# Patient Record
Sex: Female | Born: 1960 | Race: White | Hispanic: No | Marital: Married | Smoking: Former smoker
Health system: Southern US, Community
[De-identification: ages and names within clinical notes are randomized; demographics above are authoritative.]

## PROBLEM LIST (undated history)

## (undated) DIAGNOSIS — N200 Calculus of kidney: Secondary | ICD-10-CM

## (undated) DIAGNOSIS — R7303 Prediabetes: Secondary | ICD-10-CM

## (undated) DIAGNOSIS — G4730 Sleep apnea, unspecified: Secondary | ICD-10-CM

## (undated) DIAGNOSIS — I10 Essential (primary) hypertension: Secondary | ICD-10-CM

## (undated) DIAGNOSIS — Z87442 Personal history of urinary calculi: Secondary | ICD-10-CM

## (undated) DIAGNOSIS — M1990 Unspecified osteoarthritis, unspecified site: Secondary | ICD-10-CM

## (undated) DIAGNOSIS — K625 Hemorrhage of anus and rectum: Secondary | ICD-10-CM

## (undated) DIAGNOSIS — I499 Cardiac arrhythmia, unspecified: Secondary | ICD-10-CM

## (undated) DIAGNOSIS — F419 Anxiety disorder, unspecified: Secondary | ICD-10-CM

## (undated) DIAGNOSIS — L409 Psoriasis, unspecified: Secondary | ICD-10-CM

## (undated) DIAGNOSIS — J45909 Unspecified asthma, uncomplicated: Secondary | ICD-10-CM

## (undated) DIAGNOSIS — K760 Fatty (change of) liver, not elsewhere classified: Secondary | ICD-10-CM

## (undated) HISTORY — DX: Anxiety disorder, unspecified: F41.9

## (undated) HISTORY — DX: Calculus of kidney: N20.0

## (undated) HISTORY — PX: ABDOMINAL HYSTERECTOMY: SHX81

## (undated) HISTORY — PX: CHOLECYSTECTOMY: SHX55

## (undated) HISTORY — PX: APPENDECTOMY: SHX54

## (undated) HISTORY — DX: Hemorrhage of anus and rectum: K62.5

## (undated) HISTORY — DX: Cardiac arrhythmia, unspecified: I49.9

---

## 2007-01-18 ENCOUNTER — Ambulatory Visit: Payer: Self-pay | Admitting: Family Medicine

## 2007-02-05 ENCOUNTER — Ambulatory Visit: Payer: Self-pay | Admitting: Obstetrics and Gynecology

## 2008-02-21 ENCOUNTER — Ambulatory Visit: Payer: Self-pay | Admitting: Family Medicine

## 2009-11-24 ENCOUNTER — Ambulatory Visit: Payer: Self-pay | Admitting: Family Medicine

## 2010-03-08 ENCOUNTER — Ambulatory Visit: Payer: Self-pay | Admitting: Family Medicine

## 2010-06-09 ENCOUNTER — Ambulatory Visit: Payer: Self-pay | Admitting: Family Medicine

## 2011-03-10 ENCOUNTER — Ambulatory Visit: Payer: Self-pay | Admitting: Family Medicine

## 2011-05-08 ENCOUNTER — Ambulatory Visit: Payer: Self-pay | Admitting: Internal Medicine

## 2011-08-17 ENCOUNTER — Observation Stay: Payer: Self-pay | Admitting: Internal Medicine

## 2012-07-04 ENCOUNTER — Ambulatory Visit: Payer: Self-pay | Admitting: Family Medicine

## 2012-11-09 ENCOUNTER — Emergency Department: Payer: Self-pay | Admitting: Emergency Medicine

## 2012-11-09 LAB — BASIC METABOLIC PANEL
BUN: 11 mg/dL (ref 7–18)
Calcium, Total: 9.3 mg/dL (ref 8.5–10.1)
Co2: 27 mmol/L (ref 21–32)
EGFR (African American): >60
EGFR (Non-African Amer.): >60
Osmolality: 277 (ref 275–301)
Sodium: 139 mmol/L (ref 136–145)

## 2012-11-09 LAB — CBC
HCT: 39.1 % (ref 35.0–47.0)
HGB: 13.4 g/dL (ref 12.0–16.0)
MCH: 28 pg (ref 26.0–34.0)
MCHC: 34.2 g/dL (ref 32.0–36.0)
RBC: 4.77 10*6/uL (ref 3.80–5.20)
RDW: 14 % (ref 11.5–14.5)
WBC: 5.7 10*3/uL (ref 3.6–11.0)

## 2012-11-09 LAB — CK TOTAL AND CKMB (NOT AT ARMC): CK-MB: 0.7 ng/mL (ref 0.5–3.6)

## 2012-11-29 ENCOUNTER — Ambulatory Visit: Payer: Self-pay | Admitting: Gastroenterology

## 2013-06-19 ENCOUNTER — Ambulatory Visit: Payer: Self-pay | Admitting: Family Medicine

## 2013-09-13 ENCOUNTER — Emergency Department: Payer: Self-pay | Admitting: Emergency Medicine

## 2013-09-13 LAB — CBC
HCT: 37.7 % (ref 35.0–47.0)
HGB: 13.2 g/dL (ref 12.0–16.0)
MCH: 28.7 pg (ref 26.0–34.0)
WBC: 6.3 10*3/uL (ref 3.6–11.0)

## 2013-09-13 LAB — COMPREHENSIVE METABOLIC PANEL
Albumin: 3.9 g/dL (ref 3.4–5.0)
Anion Gap: 5 — ABNORMAL LOW (ref 7–16)
Bilirubin,Total: 0.5 mg/dL (ref 0.2–1.0)
Calcium, Total: 9.3 mg/dL (ref 8.5–10.1)
Creatinine: 0.85 mg/dL (ref 0.60–1.30)
EGFR (African American): >60
EGFR (Non-African Amer.): >60
Osmolality: 276 (ref 275–301)
Potassium: 3.7 mmol/L (ref 3.5–5.1)
SGOT(AST): 68 U/L — ABNORMAL HIGH (ref 15–37)
SGPT (ALT): 69 U/L (ref 12–78)
Sodium: 138 mmol/L (ref 136–145)

## 2013-09-13 LAB — URINALYSIS, COMPLETE
Glucose,UR: NEGATIVE mg/dL (ref 0–75)
Ketone: NEGATIVE
RBC,UR: 1 /HPF (ref 0–5)
Squamous Epithelial: 2

## 2014-01-29 ENCOUNTER — Ambulatory Visit: Payer: Self-pay | Admitting: Family Medicine

## 2014-02-26 ENCOUNTER — Ambulatory Visit: Payer: Self-pay | Admitting: Gastroenterology

## 2014-03-04 LAB — PATHOLOGY REPORT

## 2014-03-06 ENCOUNTER — Ambulatory Visit: Payer: Self-pay | Admitting: Gastroenterology

## 2014-07-16 DIAGNOSIS — E119 Type 2 diabetes mellitus without complications: Secondary | ICD-10-CM | POA: Insufficient documentation

## 2014-07-16 DIAGNOSIS — F329 Major depressive disorder, single episode, unspecified: Secondary | ICD-10-CM | POA: Insufficient documentation

## 2014-07-16 DIAGNOSIS — E559 Vitamin D deficiency, unspecified: Secondary | ICD-10-CM | POA: Insufficient documentation

## 2014-07-16 DIAGNOSIS — F419 Anxiety disorder, unspecified: Secondary | ICD-10-CM | POA: Insufficient documentation

## 2014-07-16 DIAGNOSIS — F32A Depression, unspecified: Secondary | ICD-10-CM | POA: Insufficient documentation

## 2014-10-17 IMAGING — CT CT ABD-PELV W/ CM
1 of 2 series · 15 of 32 positions shown, 19 images · non-contrast
Comparison: none

REASON FOR EXAM: (1) ABD PAIN; (2) ABD PAIN
COMMENTS:

PROCEDURE:     CT  - CT ABDOMEN / PELVIS  W  - September 13, 2013  [DATE]
RESULT:     History: Pain.
Comparison Study: CT 06/09/2010.

[Series 2: 3mm soft tissue · axial · 0.86mm/px · z∈[-274,+174]mm · 15 of 163 slices shown, 19 images]
[im 7/163  soft-tissue]
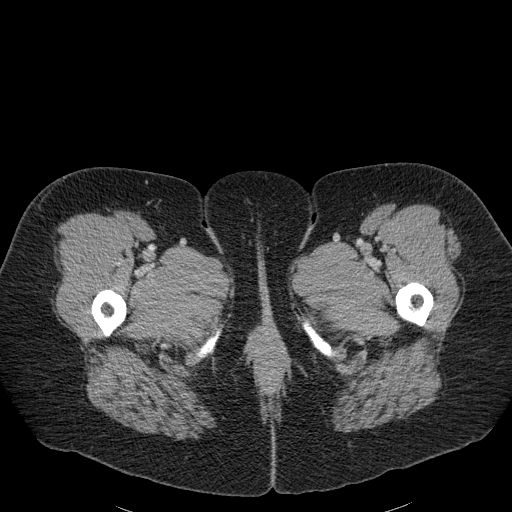
[im 7/163  bone]
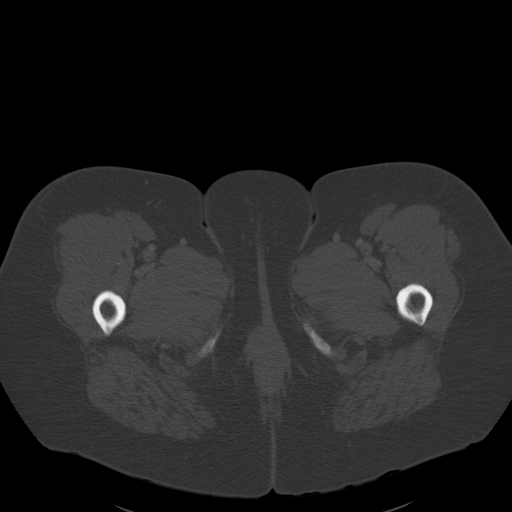
[im 21/163  soft-tissue]
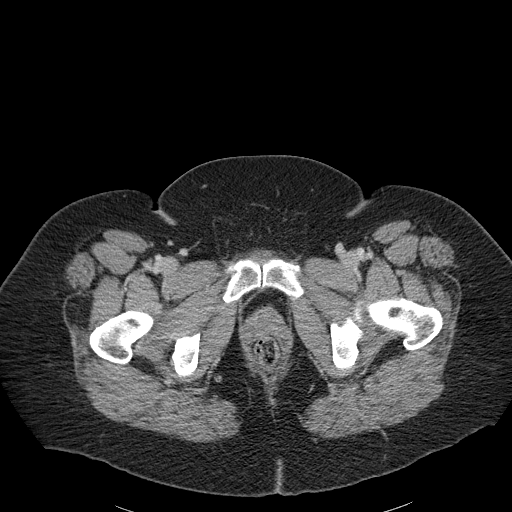
[im 34/163  soft-tissue]
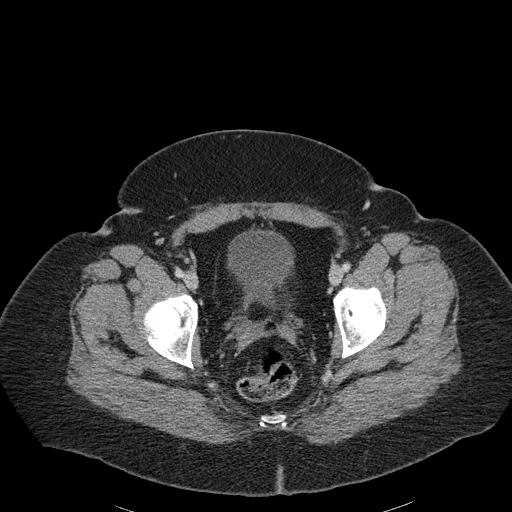
[im 48/163  soft-tissue]
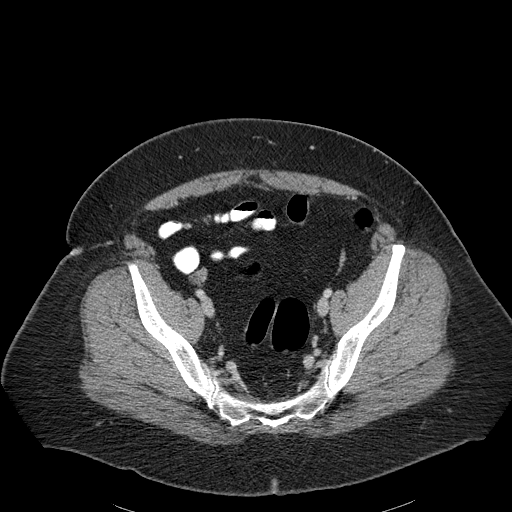
[im 55/163  soft-tissue]
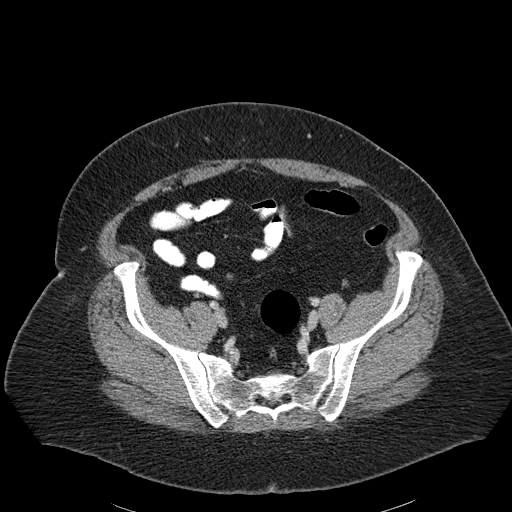
[im 68/163  soft-tissue]
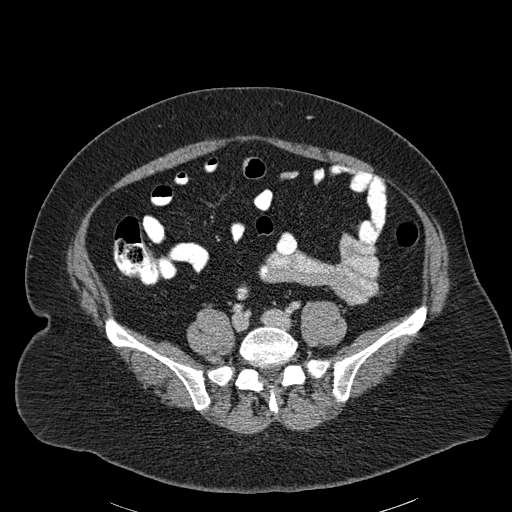
[im 82/163  soft-tissue]
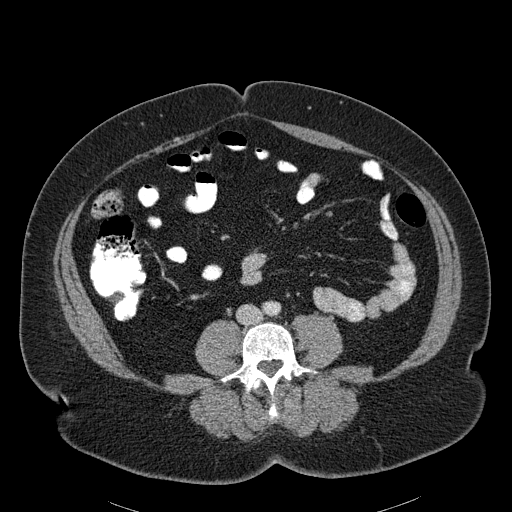
[im 95/163  soft-tissue]
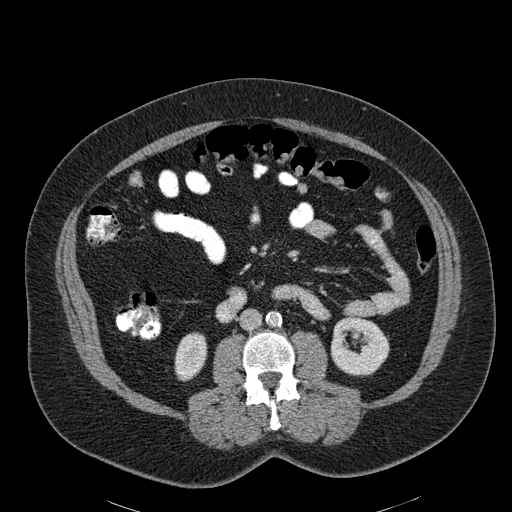
[im 109/163  soft-tissue]
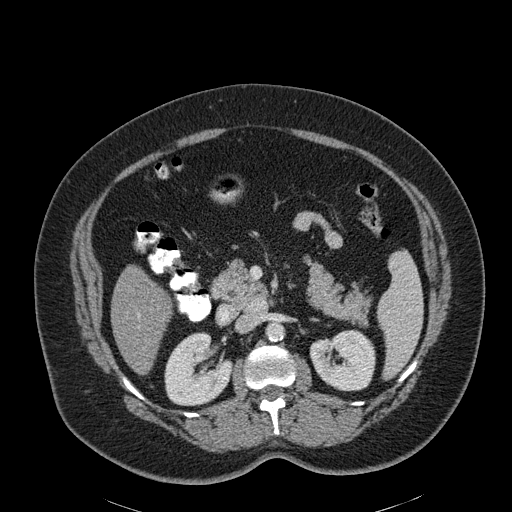
[im 109/163  bone]
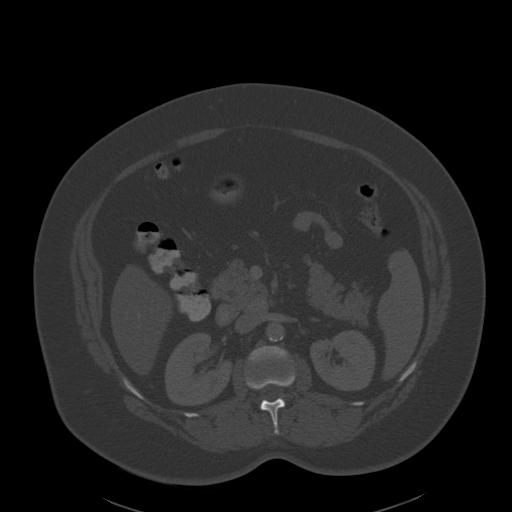
[im 115/163  soft-tissue]
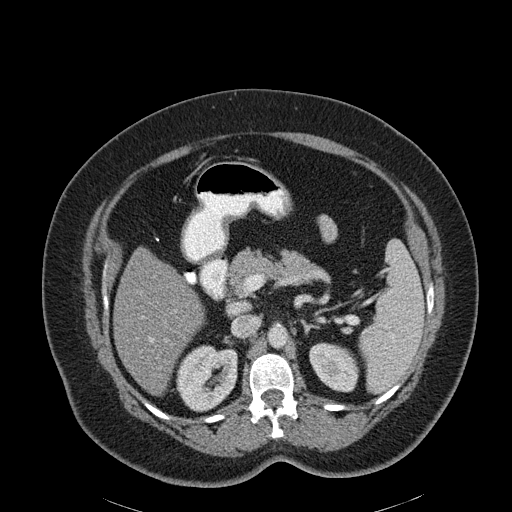
[im 129/163  soft-tissue]
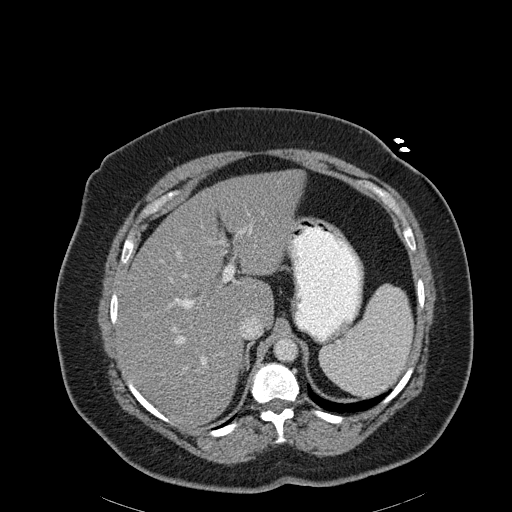
[im 136/163  lung]
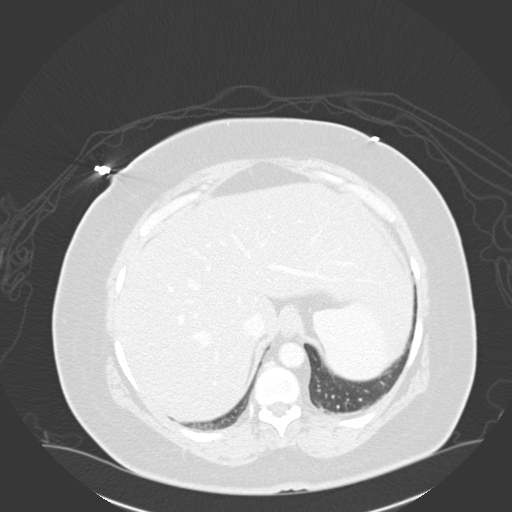
[im 142/163  soft-tissue]
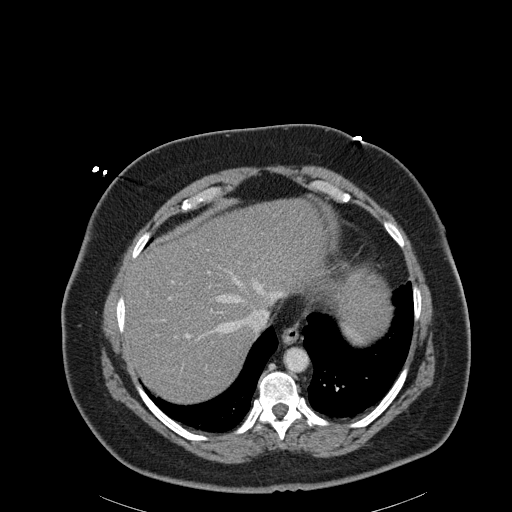
[im 142/163  lung]
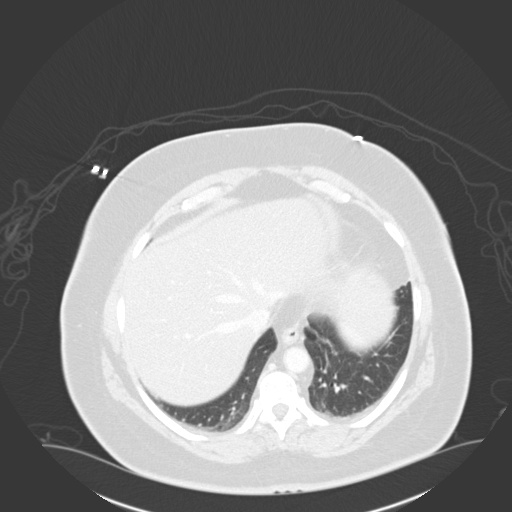
[im 149/163  lung]
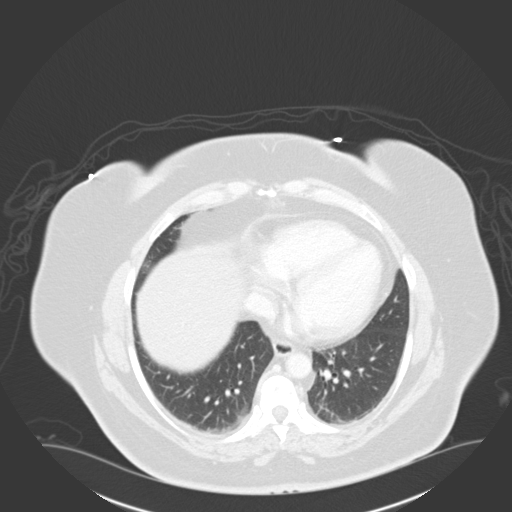
[im 156/163  soft-tissue]
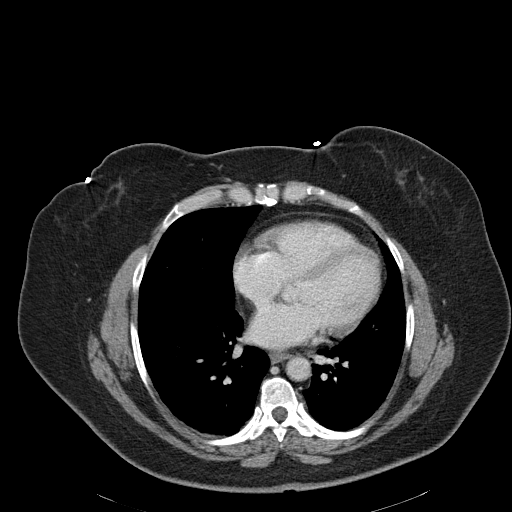
[im 156/163  lung]
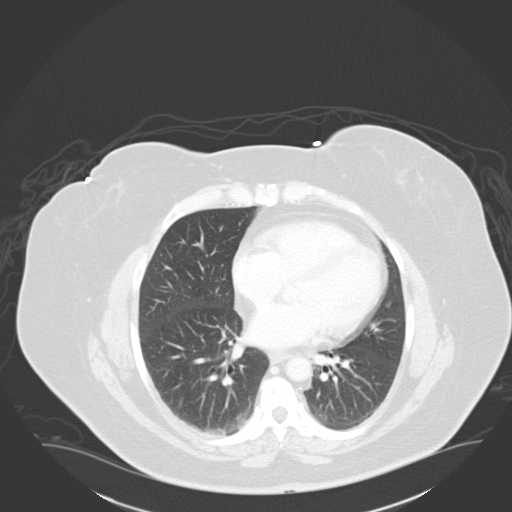

[15 of 32 positions shown; findings below may reference images not displayed]

FINDINGS: Standard CT obtained with 100 cc of Gsovue-BI2. Evaluation  in 3
dimensions on separate workstation performed. Fatty infiltration liver.
Surgical clips right upper quadrant. Prior cholecystectomy. No biliary
distention. Pancreas normal. Spleen normal. Splenic vein and portal vein
patent.

Adrenals normal. No focal renal cortical abnormality. Nonobstructive left
nephrolithiasis. No obstructing ureteral stone. No hydronephrosis. Bladder
is nondistended. Hysterectomy. Adnexa unremarkable. No free pelvic fluid.

No significant inguinal adenopathy or hernia. No retroperitoneal adenopathy.
Aorta patent. Mesenteric and renal vessels patent.

The appendix not visualized. No inflammatory change in right or left or
quadrant. Stomach nondistended. Distal esophagus unremarkable.

Heart size normal. Mild atelectasis lung bases. No acute bony abnormality.
IMPRESSION: No acute abnormality. Nonobstructive left nephrolithiasis.

## 2016-11-12 ENCOUNTER — Encounter: Payer: Self-pay | Admitting: Emergency Medicine

## 2016-11-12 ENCOUNTER — Emergency Department: Payer: BLUE CROSS/BLUE SHIELD

## 2016-11-12 ENCOUNTER — Observation Stay
Admission: EM | Admit: 2016-11-12 | Discharge: 2016-11-14 | Disposition: A | Discharge status: Home / Self Care | Payer: BLUE CROSS/BLUE SHIELD | Attending: Internal Medicine | Admitting: Internal Medicine

## 2016-11-12 DIAGNOSIS — Z833 Family history of diabetes mellitus: Secondary | ICD-10-CM | POA: Insufficient documentation

## 2016-11-12 DIAGNOSIS — R0789 Other chest pain: Secondary | ICD-10-CM

## 2016-11-12 DIAGNOSIS — Z881 Allergy status to other antibiotic agents status: Secondary | ICD-10-CM | POA: Insufficient documentation

## 2016-11-12 DIAGNOSIS — Z6841 Body Mass Index (BMI) 40.0 and over, adult: Secondary | ICD-10-CM | POA: Diagnosis not present

## 2016-11-12 DIAGNOSIS — Z8249 Family history of ischemic heart disease and other diseases of the circulatory system: Secondary | ICD-10-CM | POA: Insufficient documentation

## 2016-11-12 DIAGNOSIS — Z7982 Long term (current) use of aspirin: Secondary | ICD-10-CM | POA: Diagnosis not present

## 2016-11-12 DIAGNOSIS — L409 Psoriasis, unspecified: Secondary | ICD-10-CM | POA: Diagnosis not present

## 2016-11-12 DIAGNOSIS — F419 Anxiety disorder, unspecified: Secondary | ICD-10-CM | POA: Diagnosis not present

## 2016-11-12 DIAGNOSIS — Z880 Allergy status to penicillin: Secondary | ICD-10-CM | POA: Diagnosis not present

## 2016-11-12 DIAGNOSIS — I10 Essential (primary) hypertension: Secondary | ICD-10-CM | POA: Diagnosis not present

## 2016-11-12 DIAGNOSIS — E785 Hyperlipidemia, unspecified: Secondary | ICD-10-CM | POA: Insufficient documentation

## 2016-11-12 DIAGNOSIS — R079 Chest pain, unspecified: Secondary | ICD-10-CM | POA: Diagnosis present

## 2016-11-12 DIAGNOSIS — Z9049 Acquired absence of other specified parts of digestive tract: Secondary | ICD-10-CM | POA: Diagnosis not present

## 2016-11-12 DIAGNOSIS — Z87891 Personal history of nicotine dependence: Secondary | ICD-10-CM | POA: Insufficient documentation

## 2016-11-12 DIAGNOSIS — E7849 Other hyperlipidemia: Secondary | ICD-10-CM

## 2016-11-12 HISTORY — DX: Essential (primary) hypertension: I10

## 2016-11-12 HISTORY — DX: Psoriasis, unspecified: L40.9

## 2016-11-12 LAB — CBC
HCT: 42.6 % (ref 35.0–47.0)
HEMOGLOBIN: 14.9 g/dL (ref 12.0–16.0)
MCH: 28.7 pg (ref 26.0–34.0)
MCHC: 35 g/dL (ref 32.0–36.0)
MCV: 81.9 fL (ref 80.0–100.0)
Platelets: 179 10*3/uL (ref 150–440)
RBC: 5.2 MIL/uL (ref 3.80–5.20)
RDW: 14.1 % (ref 11.5–14.5)
WBC: 6.2 10*3/uL (ref 3.6–11.0)

## 2016-11-12 LAB — COMPREHENSIVE METABOLIC PANEL
ALK PHOS: 71 U/L (ref 38–126)
ALT: 35 U/L (ref 14–54)
ANION GAP: 8 (ref 5–15)
AST: 39 U/L (ref 15–41)
Albumin: 4.5 g/dL (ref 3.5–5.0)
BUN: 17 mg/dL (ref 6–20)
CALCIUM: 9.6 mg/dL (ref 8.9–10.3)
CO2: 26 mmol/L (ref 22–32)
CREATININE: 1.03 mg/dL — AB (ref 0.44–1.00)
Chloride: 106 mmol/L (ref 101–111)
GFR, EST NON AFRICAN AMERICAN: 60 mL/min — AB (ref >60)
Glucose, Bld: 158 mg/dL — ABNORMAL HIGH (ref 65–99)
Potassium: 3.8 mmol/L (ref 3.5–5.1)
Sodium: 140 mmol/L (ref 135–145)
TOTAL PROTEIN: 7.7 g/dL (ref 6.5–8.1)
Total Bilirubin: 0.8 mg/dL (ref 0.3–1.2)

## 2016-11-12 LAB — FIBRIN DERIVATIVES D-DIMER (ARMC ONLY): FIBRIN DERIVATIVES D-DIMER (ARMC): 376 (ref 0–499)

## 2016-11-12 LAB — TSH: TSH: 4.731 u[IU]/mL — AB (ref 0.350–4.500)

## 2016-11-12 LAB — TROPONIN I: Troponin I: <0.03 ng/mL (ref <0.03)

## 2016-11-12 MED ORDER — SODIUM CHLORIDE 0.9 % IV SOLN
INTRAVENOUS | Status: DC
  Administered 2016-11-12 – 2016-11-14 (×4): via INTRAVENOUS

## 2016-11-12 MED ORDER — GI COCKTAIL ~~LOC~~: 30.0000 mL | Freq: Four times a day (QID) | ORAL | Status: DC | PRN

## 2016-11-12 MED ORDER — ENOXAPARIN SODIUM 100 MG/ML ~~LOC~~ SOLN
1.0000 mg/kg | Freq: Once | SUBCUTANEOUS | Status: AC
Start: 2016-11-12 — End: 2016-11-12
  Administered 2016-11-12: 100 mg via SUBCUTANEOUS
  Filled 2016-11-12: qty 1

## 2016-11-12 MED ORDER — ALPRAZOLAM 0.5 MG PO TABS: 0.2500 mg | ORAL_TABLET | Freq: Two times a day (BID) | ORAL | Status: DC | PRN

## 2016-11-12 MED ORDER — ACETAMINOPHEN 325 MG PO TABS: 650.0000 mg | ORAL_TABLET | ORAL | Status: DC | PRN

## 2016-11-12 MED ORDER — NITROGLYCERIN 0.4 MG SL SUBL
0.4000 mg | SUBLINGUAL_TABLET | Freq: Once | SUBLINGUAL | Status: AC
  Administered 2016-11-12: 0.4 mg via SUBLINGUAL

## 2016-11-12 MED ORDER — ENOXAPARIN SODIUM 100 MG/ML ~~LOC~~ SOLN
1.0000 mg/kg | Freq: Two times a day (BID) | SUBCUTANEOUS | Status: DC
  Administered 2016-11-13: 100 mg via SUBCUTANEOUS
  Filled 2016-11-12: qty 1

## 2016-11-12 MED ORDER — MORPHINE SULFATE (PF) 4 MG/ML IV SOLN
4.0000 mg | Freq: Once | INTRAVENOUS | Status: AC
  Administered 2016-11-12: 4 mg via INTRAVENOUS
  Filled 2016-11-12: qty 1

## 2016-11-12 MED ORDER — MORPHINE SULFATE (PF) 4 MG/ML IV SOLN
2.0000 mg | INTRAVENOUS | Status: DC | PRN
  Administered 2016-11-12 – 2016-11-13 (×2): 2 mg via INTRAVENOUS
  Filled 2016-11-12 (×2): qty 1

## 2016-11-12 MED ORDER — ASPIRIN 81 MG PO CHEW
324.0000 mg | CHEWABLE_TABLET | Freq: Once | ORAL | Status: AC
  Administered 2016-11-12: 324 mg via ORAL
  Filled 2016-11-12: qty 4

## 2016-11-12 MED ORDER — NITROGLYCERIN 0.4 MG SL SUBL
0.4000 mg | SUBLINGUAL_TABLET | Freq: Once | SUBLINGUAL | Status: AC
  Administered 2016-11-12: 0.4 mg via SUBLINGUAL
  Filled 2016-11-12: qty 1

## 2016-11-12 MED ORDER — ONDANSETRON HCL 4 MG/2ML IJ SOLN
4.0000 mg | Freq: Four times a day (QID) | INTRAMUSCULAR | Status: DC | PRN
  Administered 2016-11-12 – 2016-11-13 (×3): 4 mg via INTRAVENOUS
  Filled 2016-11-12 (×3): qty 2

## 2016-11-12 MED ORDER — ZOLPIDEM TARTRATE 5 MG PO TABS: 5.0000 mg | ORAL_TABLET | Freq: Every evening | ORAL | Status: DC | PRN

## 2016-11-12 MED ORDER — METOPROLOL TARTRATE 25 MG PO TABS
25.0000 mg | ORAL_TABLET | Freq: Two times a day (BID) | ORAL | Status: DC
  Administered 2016-11-12: 25 mg via ORAL
  Filled 2016-11-12: qty 1

## 2016-11-12 MED ORDER — ASPIRIN EC 81 MG PO TBEC
81.0000 mg | DELAYED_RELEASE_TABLET | Freq: Every day | ORAL | Status: DC
  Administered 2016-11-13 – 2016-11-14 (×2): 81 mg via ORAL
  Filled 2016-11-12 (×2): qty 1

## 2016-11-12 NOTE — ED Notes (Signed)
Pt transported to room 236

## 2016-11-12 NOTE — Progress Notes (Signed)
Patient is admitted to room 236 with he diagnosis of chest pain. Alert and oriented x 4. No acute distress noted. Tele box called to CCMD with Gelene Mink. RN as a second verifier. Vincente Liberty also assisted in doing skin assessment. Skin dry and intact but noted psoriasis on bilateral palms and sole of feet. No issues noted. Morphine 2 mg iv prn administered for  chest pain of 5/10 on a pain scale and will be re-assess momentarily. Will continue to monitor.

## 2016-11-12 NOTE — ED Notes (Signed)
Attempted to call report, Network engineer states receiving nurse in isolation room and will call back

## 2016-11-12 NOTE — Progress Notes (Signed)
ANTICOAGULATION CONSULT NOTE - Initial Consult  Pharmacy Consult for LMWH Indication: chest pain/ACS  Allergies  Allergen Reactions  . Ciprocin-Fluocin-Procin [Fluocinolone] Rash  . Erythromycin Rash  . Penicillins Rash    Has patient had a PCN reaction causing immediate rash, facial/tongue/throat swelling, SOB or lightheadedness with hypotension: no Has patient had a PCN reaction causing severe rash involving mucus membranes or skin necrosis: no Has patient had a PCN reaction that required hospitalization no Has patient had a PCN reaction occurring within the last 10 years: no If all of the above answers are "NO", then may proceed with Cephalosporin use.    . Tetracyclines & Related Rash    Patient Measurements: Height: 5' (152.4 cm) Weight: 220 lb (99.8 kg) IBW/kg (Calculated) : 45.5 Heparin Dosing Weight:   Vital Signs: Temp: 98.1 F (36.7 C) (11/25 2024) Temp Source: Oral (11/25 2024) BP: 159/70 (11/25 2230) Pulse Rate: 89 (11/25 2230)  Labs:  Recent Labs  11/12/16 1820  HGB 14.9  HCT 42.6  PLT 179  CREATININE 1.03*  TROPONINI <0.03    Estimated Creatinine Clearance: 64.7 mL/min (by C-G formula based on SCr of 1.03 mg/dL (H)).   Medical History: Past Medical History:  Diagnosis Date  . Hypertension   . Psoriasis     Medications:  Infusions:  . sodium chloride      Assessment: 55 yof cc CP, radiating to back and neck since yesterday. SOB, dizziness, nausea, back pain. Sx improved with NTG. Pharmacy consulted to dose LMWH while r/o.  Goal of Therapy:  Anti-Xa level 0.6-1 units/ml 4hrs after LMWH dose given Monitor platelets by anticoagulation protocol: Yes   Plan:  Lovenox 100 mg subcutaneously Q12H  Laural Benes, Pharm.D., BCPS Clinical Pharmacist 11/12/2016,11:01 PM

## 2016-11-12 NOTE — ED Triage Notes (Signed)
Pt reports central chest pain that radiates to back and neck that began yesterday. Pt states she thought the pain was indigestion but it got worse today. Pt reports SOB, dizziness, nausea and back pain.

## 2016-11-12 NOTE — ED Provider Notes (Addendum)
[facility] Emergency Department Provider Note        Time seen: ----------------------------------------- 8:31 PM on 11/12/2016 -----------------------------------------    I have reviewed the triage vital signs and the nursing notes.   HISTORY  Chief Complaint Chest Pain    HPI Amber Ortiz is a 55 y.o. female presents to the ER with chest pain that radiates into her back and neck began yesterday. Patient states she thought it was indigestion but it got worse today. Patient has never had this happen before, nothing makes it better or worse. She has had shortness of breath, dizziness, nausea and back pain. Patient reports family history as well as high blood pressure, high cholesterol, diabetes, obesity and previous tobacco use.   Past Medical History:  Diagnosis Date  . Hypertension   . Psoriasis     There are no active problems to display for this patient.   No past surgical history on file.  Allergies Ciprocin-fluocin-procin [fluocinolone]; Erythromycin; Penicillins; and Tetracyclines & related  Social History Social History  Substance Use Topics  . Smoking status: Not on file  . Smokeless tobacco: Not on file  . Alcohol use Not on file    Review of Systems Constitutional: Negative for fever. Cardiovascular:Positive for chest pain Respiratory: Positive for shortness of breath Gastrointestinal: Negative for abdominal pain, positive for nausea Genitourinary: Negative for dysuria. Musculoskeletal: Positive for back pain Skin: Negative for rash. Neurological: Negative for headaches, focal weakness or numbness.  10-point ROS otherwise negative.  ____________________________________________   PHYSICAL EXAM:  VITAL SIGNS: ED Triage Vitals  Enc Vitals Group     BP 11/12/16 1818 (!) 186/52     Pulse Rate 11/12/16 1818 100     Resp 11/12/16 1818 20     Temp 11/12/16 1818 97.9 F (36.6 C)     Temp Source 11/12/16 1818 Oral      SpO2 11/12/16 1818 95 %     Weight 11/12/16 1815 220 lb (99.8 kg)     Height 11/12/16 1815 5' (1.524 m)     Head Circumference --      Peak Flow --      Pain Score 11/12/16 1815 8     Pain Loc --      Pain Edu? --      Excl. in [place]? --     Constitutional: Alert and oriented. Mild distress Eyes: Conjunctivae are normal. PERRL. Normal extraocular movements. ENT   Head: Normocephalic and atraumatic.   Nose: No congestion/rhinnorhea.   Mouth/Throat: Mucous membranes are moist.   Neck: No stridor. Cardiovascular: Normal rate, regular rhythm. No murmurs, rubs, or gallops. Respiratory: Normal respiratory effort without tachypnea nor retractions. Breath sounds are clear and equal bilaterally. No wheezes/rales/rhonchi. Gastrointestinal: Soft and nontender. Normal bowel sounds Musculoskeletal: Nontender with normal range of motion in all extremities. No lower extremity tenderness nor edema. Neurologic:  Normal speech and language. No gross focal neurologic deficits are appreciated.  Skin:  Skin is warm, dry and intact. No rash noted. Psychiatric: Mood and affect are normal. Speech and behavior are normal.  ____________________________________________  EKG: Interpreted by me. Sinus tachycardia with a rate of 106 bpm, normal PR interval, normal QRS sinus, normal QT, nonspecific ST and T-wave abnormalities  Repeat EKG interpreted by me reveals sinus rhythm with a rate of 99 bpm, normal PR interval, normal QRS sinus, normal QT, nonspecific ST and T-wave abnormalities ____________________________________________  ED COURSE:  Pertinent labs & imaging results that were available during my care  of the patient were reviewed by me and considered in my medical decision making (see chart for details). Clinical Course   Patient presents to ER with severe chest pain uncertain etiology. We will assess with labs and imaging. She'll receive aspirin, nitroglycerin and  morphine.  Procedures ____________________________________________   LABS (pertinent positives/negatives)  Labs Reviewed  COMPREHENSIVE METABOLIC PANEL - Abnormal; Notable for the following:       Result Value   Glucose, Bld 158 (*)    Creatinine, Ser 1.03 (*)    GFR calc non Af Amer 60 (*)    All other components within normal limits  CBC  TROPONIN I  FIBRIN DERIVATIVES D-DIMER ([facility] ONLY)  CRITICAL CARE Performed by: Earleen Newport   Total critical care time: 30 minutes  Critical care time was exclusive of separately billable procedures and treating other patients.  Critical care was necessary to treat or prevent imminent or life-threatening deterioration.  Critical care was time spent personally by me on the following activities: development of treatment plan with patient and/or surrogate as well as nursing, discussions with consultants, evaluation of patient's response to treatment, examination of patient, obtaining history from patient or surrogate, ordering and performing treatments and interventions, ordering and review of laboratory studies, ordering and review of radiographic studies, pulse oximetry and re-evaluation of patient's condition.   RADIOLOGY  Chest x-ray is unremarkable IMPRESSION: No active cardiopulmonary disease. ____________________________________________  FINAL ASSESSMENT AND PLAN  Chest pain   Plan: Patient with labs and imaging as dictated above. Patient presents to ER with concerning chest pain and his high risk according to heart score. She did have improvement in her symptoms with nitroglycerin. Patient was placed on Lovenox, I will discuss with the hospitalist for a formal cardiac rule out and likely stress testing.   Earleen Newport, MD   Note: This dictation was prepared with Dragon dictation. Any transcriptional errors that result from this process are unintentional    Earleen Newport, MD 11/12/16 2101     Earleen Newport, MD 11/12/16 2102

## 2016-11-12 NOTE — H&P (Signed)
[facility] Admission History and Physical [employer], D.O.  ---------------------------------------------------------------------------------------------------------------------   PATIENT NAME: Amber Ortiz MR#: [id number] DATE OF BIRTH: 1961-06-18 DATE OF ADMISSION: 11/12/2016 PRIMARY CARE PHYSICIAN: No primary care provider on file.  REQUESTING/REFERRING PHYSICIAN: ED Dr. Jimmye Norman  CHIEF COMPLAINT: Chief Complaint  Patient presents with  . Chest Pain    HISTORY OF PRESENT ILLNESS: Amber Ortiz is a 55 y.o. female with a known history of Hypertension, hyperlipidemia and psoriasis presents to the emergency department for evaluation of chest pain.  Patient was in a usual state of health until this evening when she developed sudden onset of severe crushing chest pain that radiated up to her jaw and through to the back while she was at rest. Her pain was relieved by nitroglycerin in the emergency department and was associated with nausea and palpitations. Patient states that she had what sounds like a nuclear stress test which she failed but no further cardiovascular testing or intervention following that.  At this time she reports continued chest pressure described as 5 out of 10.  Otherwise there has been no change in status. Patient has been taking medication as prescribed and there has been no recent change in medication or diet.  There has been no recent illness, travel or sick contacts.    Patient denies fevers/chills, weakness, dizziness, shortness of breath, N/V/C/D, abdominal pain, dysuria/frequency, changes in mental status.   EMS/ED COURSE:   Patient received morphine, nitroglycerin, aspirin and Lovenox.Marland Kitchen  PAST MEDICAL HISTORY: Past Medical History:  Diagnosis Date  . Hypertension   . Psoriasis       PAST SURGICAL HISTORY: No past surgical history on file.    SOCIAL HISTORY: Social History  Substance Use Topics  . Smoking  status: Not on file  . Smokeless tobacco: Not on file  . Alcohol use Not on file   Patient is a former smoker, quit 30 years ago.   FAMILY HISTORY: No family history on file.   MEDICATIONS AT HOME: Prior to Admission medications   Not on File      DRUG ALLERGIES: Allergies  Allergen Reactions  . Ciprocin-Fluocin-Procin [Fluocinolone] Rash  . Erythromycin Rash  . Penicillins Rash  . Tetracyclines & Related Rash     REVIEW OF SYSTEMS: CONSTITUTIONAL: No fatigue, weakness, fever, chills, weight gain/loss, headache EYES: No blurry or double vision. ENT: No tinnitus, postnasal drip, redness or soreness of the oropharynx. RESPIRATORY: No dyspnea, cough, wheeze, hemoptysis. CARDIOVASCULAR: Positive chest pain, negative orthopnea, palpitations, syncope. GASTROINTESTINAL: Positive nausea, negative vomiting, constipation, diarrhea, abdominal pain. No hematemesis, melena or hematochezia. GENITOURINARY: No dysuria, frequency, hematuria. ENDOCRINE: No polyuria or nocturia. No heat or cold intolerance. HEMATOLOGY: No anemia, bruising, bleeding. INTEGUMENTARY: No rashes, ulcers, lesions. MUSCULOSKELETAL: No pain, arthritis, swelling, gout. NEUROLOGIC: No numbness, tingling, weakness or ataxia. No seizure-type activity. PSYCHIATRIC: No anxiety, depression, insomnia.  PHYSICAL EXAMINATION: VITAL SIGNS: Blood pressure (!) 129/50, pulse 95, temperature 98.1 F (36.7 C), temperature source Oral, resp. rate 19, height 5' (1.524 m), weight 99.8 kg (220 lb), SpO2 94 %.  GENERAL: 55 y.o.-year-old white female patient, well-developed, well-nourished lying in the bed in no acute distress.  Pleasant and cooperative.  Anxious. HEENT: Head atraumatic, normocephalic. Pupils equal, round, reactive to light and accommodation. No scleral icterus. Extraocular muscles intact. Oropharynx is clear. Mucus membranes moist. NECK: Supple, full range of motion. No JVD, no bruit heard. No cervical  lymphadenopathy. CHEST: Normal breath sounds bilaterally. No wheezing, rales, rhonchi or crackles.  No use of accessory muscles of respiration.  No reproducible chest wall tenderness.  CARDIOVASCULAR: S1, S2 normal. No murmurs, rubs, or gallops appreciated. Cap refill <2 seconds. ABDOMEN: Soft, nontender, nondistended. No rebound, guarding, rigidity. Normoactive bowel sounds present in all four quadrants. No organomegaly or mass. EXTREMITIES: Bilateral hand swelling, right greater than left. No pedal edema, cyanosis, or clubbing. NEUROLOGIC: Cranial nerves II through XII are grossly intact with no focal sensorimotor deficit. Muscle strength 5/5 in all extremities. Sensation intact. Gait not checked. PSYCHIATRIC: The patient is alert and oriented x 3. Normal affect, mood, thought content. SKIN: Warm, dry, and intact without obvious rash, lesion, or ulcer.  LABORATORY PANEL:  CBC  Recent Labs Lab 11/12/16 1820  WBC 6.2  HGB 14.9  HCT 42.6  PLT 179   ----------------------------------------------------------------------------------------------------------------- Chemistries  Recent Labs Lab 11/12/16 1820  NA 140  K 3.8  CL 106  CO2 26  GLUCOSE 158*  BUN 17  CREATININE 1.03*  CALCIUM 9.6  AST 39  ALT 35  ALKPHOS 71  BILITOT 0.8   ------------------------------------------------------------------------------------------------------------------ Cardiac Enzymes  Recent Labs Lab 11/12/16 1820  TROPONINI <0.03   ------------------------------------------------------------------------------------------------------------------  RADIOLOGY: Dg Chest 2 View  Result Date: 11/12/2016 CLINICAL DATA:  Patient with chest pain radiating to the back. EXAM: CHEST  2 VIEW COMPARISON:  Chest radiograph 11/09/2012. FINDINGS: Normal cardiac and mediastinal contours. No consolidative pulmonary opacities. No pleural effusion or pneumothorax. Mid thoracic spine degenerative changes.  Cholecystectomy clips. IMPRESSION: No active cardiopulmonary disease. Electronically Signed   By: Lovey Newcomer M.D.   On: 11/12/2016 19:23    EKG: Sinus tachycardia at 106 bpm with normal axis and nonspecific ST-T wave changes.  Second EKG was normal sinus rhythm at 99 bpm with normal axis and nonspecific ST and T wave changes. IMPRESSION AND PLAN:  This is a 55 y.o. female with a history of hypertension, hyperlipidemia and psoriasis now being admitted with: 1. Chest pain, rule out ACS - Admit to observation with telemetry monitoring. - Trend troponins, check lipids and TSH. - Morphine, nitro, beta blocker, aspirin and statin ordered.   - Patient received 1 dose of weight-based Lovenox in the emergency department - Cardiology consult requested.   2. AK I-IV fluid hydration and recheck BMP in the a.m. 3. History of hypertension-continue metoprolol and lisinopril 4. History of hyperlipidemia-continue pravastatin  Diet/Nutrition: Heart healthy Fluids: IV normal saline DVT Px: Lovenox, SCDs and early ambulation Code Status: Full  All the records are reviewed and case discussed with ED provider. Management plans discussed with the patient and/or family who express understanding and agree with plan of care.   TOTAL TIME TAKING CARE OF THIS PATIENT: 60 minutes.   Stacy Sailer D.O. on 11/12/2016 at 9:51 PM Between 7am to 6pm - Pager - [phone] After 6pm go to [email or web address] - [employer] [place] [facility] Office [phone] CC: Primary care physician; No primary care provider on file.     Note: This dictation was prepared with Dragon dictation along with smaller phrase technology. Any transcriptional errors that result from this process are unintentional.

## 2016-11-13 DIAGNOSIS — E784 Other hyperlipidemia: Secondary | ICD-10-CM

## 2016-11-13 DIAGNOSIS — I10 Essential (primary) hypertension: Secondary | ICD-10-CM

## 2016-11-13 DIAGNOSIS — E7849 Other hyperlipidemia: Secondary | ICD-10-CM

## 2016-11-13 DIAGNOSIS — R079 Chest pain, unspecified: Secondary | ICD-10-CM

## 2016-11-13 LAB — CBC
HEMATOCRIT: 37 % (ref 35.0–47.0)
Hemoglobin: 13 g/dL (ref 12.0–16.0)
MCH: 28.3 pg (ref 26.0–34.0)
MCHC: 35.1 g/dL (ref 32.0–36.0)
MCV: 80.6 fL (ref 80.0–100.0)
PLATELETS: 145 10*3/uL — AB (ref 150–440)
RBC: 4.59 MIL/uL (ref 3.80–5.20)
RDW: 13.8 % (ref 11.5–14.5)
WBC: 4.8 10*3/uL (ref 3.6–11.0)

## 2016-11-13 LAB — BASIC METABOLIC PANEL
ANION GAP: 6 (ref 5–15)
BUN: 17 mg/dL (ref 6–20)
CO2: 26 mmol/L (ref 22–32)
Calcium: 9.1 mg/dL (ref 8.9–10.3)
Chloride: 108 mmol/L (ref 101–111)
Creatinine, Ser: 0.87 mg/dL (ref 0.44–1.00)
Glucose, Bld: 143 mg/dL — ABNORMAL HIGH (ref 65–99)
POTASSIUM: 3.9 mmol/L (ref 3.5–5.1)
SODIUM: 140 mmol/L (ref 135–145)

## 2016-11-13 LAB — LIPID PANEL
CHOL/HDL RATIO: 7 ratio
CHOLESTEROL: 204 mg/dL — AB (ref 0–200)
HDL: 29 mg/dL — AB (ref >40)
LDL Cholesterol: UNDETERMINED mg/dL (ref 0–99)
TRIGLYCERIDES: 439 mg/dL — AB (ref <150)
VLDL: UNDETERMINED mg/dL (ref 0–40)

## 2016-11-13 LAB — TROPONIN I: Troponin I: <0.03 ng/mL (ref <0.03)

## 2016-11-13 MED ORDER — AMMONIUM LACTATE 12 % EX CREA: 1.0000 g | TOPICAL_CREAM | CUTANEOUS | Status: DC | PRN

## 2016-11-13 MED ORDER — FLUOCINONIDE 0.05 % EX CREA: TOPICAL_CREAM | Freq: Every day | CUTANEOUS | Status: DC | PRN

## 2016-11-13 MED ORDER — MUPIROCIN 2 % EX OINT
1.0000 "application " | TOPICAL_OINTMENT | Freq: Four times a day (QID) | CUTANEOUS | Status: DC
  Administered 2016-11-13: 1 via TOPICAL
  Filled 2016-11-13: qty 22

## 2016-11-13 MED ORDER — LISINOPRIL 10 MG PO TABS
10.0000 mg | ORAL_TABLET | Freq: Every day | ORAL | Status: DC
  Administered 2016-11-13 – 2016-11-14 (×2): 10 mg via ORAL
  Filled 2016-11-13 (×2): qty 1

## 2016-11-13 MED ORDER — METOPROLOL SUCCINATE ER 50 MG PO TB24
50.0000 mg | ORAL_TABLET | Freq: Every day | ORAL | Status: DC
  Administered 2016-11-13 – 2016-11-14 (×2): 50 mg via ORAL
  Filled 2016-11-13 (×2): qty 1

## 2016-11-13 MED ORDER — ENOXAPARIN SODIUM 40 MG/0.4ML ~~LOC~~ SOLN: 40.0000 mg | SUBCUTANEOUS | Status: DC

## 2016-11-13 MED ORDER — PRAVASTATIN SODIUM 10 MG PO TABS
10.0000 mg | ORAL_TABLET | Freq: Every day | ORAL | Status: DC
  Administered 2016-11-13: 10 mg via ORAL
  Filled 2016-11-13: qty 1

## 2016-11-13 NOTE — Consult Note (Signed)
Cardiology Consultation   Patient ID: Amber Ortiz; [id number]; 04-10-1961   Admit date: 11/12/2016 Date of Consult: 11/13/2016  Referring MD:  Dr. Verdell Carmine  No care team member to display    Reason for Consultation: CP   History of Present Illness: Amber Ortiz is a 55 y.o. female with a hx of hypertension, hyperlipidemia, psoriasis. She presented with new onset of chest pain 11/12/2016 around 12 noon to 1 PM. She was getting ready to go out for lunch at that time. She describes the chest pain that was 10 out of 10 severity, radiating to the back. There is also chest pressure at the same time. The symptoms lasted until she got to the ER and received medications. That was continuous for at least 2 hours. There was associated nausea and dizziness. No syncope or diaphoresis. She describes noting relief of the chest pain when she received nitroglycerin in the ER. The chest pressure subsided but continued on with an intensity of 3 out of 10. Currently, she continues to complain of some chest pressure, mild in intensity. No recurrence of chest pain.  Patient has shortness of breath with walking long distances. This has been going on for quite some time now. She attributes it to her weight. No PND, orthopnea, edema.  ROS:  Please see the history of present illness.  ROS All other ROS reviewed and negative.    Past Medical History:  Diagnosis Date  . Hypertension   . Psoriasis     History reviewed. No pertinent surgical history.    Home Meds: Prior to Admission medications   Medication Sig Start Date End Date Taking? Authorizing Provider  ammonium lactate (AMLACTIN) 12 % cream Apply 1 g topically as needed for dry skin.   Yes Historical Provider, MD  escitalopram (LEXAPRO) 20 MG tablet Take 20 mg by mouth at bedtime.   Yes Historical Provider, MD  Halcinonide (HALOG) 0.1 % CREA Apply 1 application topically daily as needed.   Yes Historical Provider, MD  lisinopril  (PRINIVIL,ZESTRIL) 10 MG tablet Take 1 tablet by mouth daily. 09/19/16  Yes Historical Provider, MD  metoprolol succinate (TOPROL-XL) 50 MG 24 hr tablet Take 50 mg by mouth daily. 09/23/16  Yes Historical Provider, MD  mupirocin ointment (BACTROBAN) 2 % Apply 1 application topically 4 (four) times daily. Apply to hands 10/27/16  Yes Historical Provider, MD  pravastatin (PRAVACHOL) 10 MG tablet Take 1 tablet by mouth at bedtime. 09/24/16  Yes Historical Provider, MD  Secukinumab (COSENTYX 300 DOSE) 150 MG/ML SOSY Inject 300 mg into the skin every 30 (thirty) days.   Yes Historical Provider, MD  Vitamin D, Ergocalciferol, (DRISDOL) 50000 units CAPS capsule Take 1 capsule by mouth every 7 (seven) days. 09/22/16  Yes Historical Provider, MD  predniSONE (DELTASONE) 10 MG tablet Take 1 tablet by mouth as directed. 10/27/16   Historical Provider, MD    Current Medications: . aspirin EC  81 mg Oral Daily  . enoxaparin (LOVENOX) injection  1 mg/kg Subcutaneous Q12H  . lisinopril  10 mg Oral Daily  . metoprolol succinate  50 mg Oral Daily  . mupirocin ointment  1 application Topical QID  . pravastatin  10 mg Oral QHS     Allergies:    Allergies  Allergen Reactions  . Ciprocin-Fluocin-Procin [Fluocinolone] Rash  . Erythromycin Rash  . Penicillins Rash    Has patient had a PCN reaction causing immediate rash, facial/tongue/throat swelling, SOB or lightheadedness with hypotension: no Has patient had a PCN  reaction causing severe rash involving mucus membranes or skin necrosis: no Has patient had a PCN reaction that required hospitalization no Has patient had a PCN reaction occurring within the last 10 years: no If all of the above answers are "NO", then may proceed with Cephalosporin use.    . 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 She admits to smoking half a pack a day for about 10 years. Quit probably 29 years ago.  Family  History:   The patient's family history is not on file.  CAD in mother in her 17s, and brother in his 61s No sudden cardiac death  PHYSICAL EXAM: VS:  BP (!) 142/58   Pulse 76   Temp 97.6 F (36.4 C) (Oral)   Resp 14   Ht 5' (1.524 m)   Wt 220 lb (99.8 kg)   SpO2 96%   BMI 42.97 kg/m  , BMI Body mass index is 42.97 kg/m. GENERAL:  well developed, well nourished, obese, not in acute distress HEENT: normocephalic, pink conjunctivae, anicteric sclerae, no xanthelasma, normal dentition, oropharynx clear NECK:  no neck vein engorgement, JVP normal, no hepatojugular reflux, carotid upstroke brisk and symmetric, no bruit, no thyromegaly, no lymphadenopathy LUNGS:  good respiratory effort, clear to auscultation bilaterally CV:  PMI not displaced, no thrills, no lifts, S1 and S2 within normal limits, no palpable S3 or S4, no murmurs, no rubs, no gallops ABD:  Soft, nontender, nondistended, normoactive bowel sounds, no abdominal aortic bruit, no hepatomegaly, no splenomegaly MS: nontender back, no kyphosis, no scoliosis, no joint deformities EXT:  2+ DP/PT pulses, no edema, no varicosities, no cyanosis, no clubbing SKIN: warm, nondiaphoretic, normal turgor, no ulcers NEUROPSYCH: alert, oriented to person, place, and time, sensory/motor grossly intact, normal mood, appropriate affect    EKG: EKG from 11/13/2016 at 6:58 AM was personally reviewed by me and it showed sinus rhythm 71 BPM, nonspecific ST-T wave changes. No significant change from EKG 09/13/2013.  Labs:  Recent Labs  11/12/16 1820 11/12/16 2312 11/13/16 0220 11/13/16 0418  TROPONINI <0.03 <0.03 <0.03 <0.03   Lab Results  Component Value Date   WBC 4.8 11/13/2016   HGB 13.0 11/13/2016   HCT 37.0 11/13/2016   MCV 80.6 11/13/2016   PLT 145 (L) 11/13/2016    Recent Labs Lab 11/12/16 1820 11/13/16 0418  NA 140 140  K 3.8 3.9  CL 106 108  CO2 26 26  BUN 17 17  CREATININE 1.03* 0.87  CALCIUM 9.6 9.1  PROT 7.7   --   BILITOT 0.8  --   ALKPHOS 71  --   ALT 35  --   AST 39  --   GLUCOSE 158* 143*   Lab Results  Component Value Date   CHOL 204 (H) 11/13/2016   HDL 29 (L) 11/13/2016   LDLCALC UNABLE TO CALCULATE IF TRIGLYCERIDE OVER 400 mg/dL 11/13/2016   TRIG 439 (H) 11/13/2016   No results found for: DDIMER  Radiology/Studies:  Dg Chest 2 View  Result Date: 11/12/2016 CLINICAL DATA:  Patient with chest pain radiating to the back. EXAM: CHEST  2 VIEW COMPARISON:  Chest radiograph 11/09/2012. FINDINGS: Normal cardiac and mediastinal contours. No consolidative pulmonary opacities. No pleural effusion or pneumothorax. Mid thoracic spine degenerative changes. Cholecystectomy clips. IMPRESSION: No active cardiopulmonary disease. Electronically Signed   By: Lovey Newcomer M.D.   On: 11/12/2016 19:23     PROBLEM LIST:  Active Problems:   Chest pain, rule out acute myocardial infarction     ASSESSMENT AND PLAN:  Chest pain/chest pressure or Ruled out for acute coronary syndrome Chest pain resolved, mild chest pressure ongoing EKG with nonspecific ST-T wave changes that are not changed from EKG in 2014 Risk factors for CAD include hypertension, hyperlipidemia, free diabetes state, family history, previous smoking history Recommend echocardiogram and exercise nuclear stress test in the morning for further evaluation. Continue with risk factor modification  Hypertension Blood pressure goal less than 130/80. Continue with antihypertensive medications. Dietary and Lifestyle changes recommended.  Hyperlipidemia Uptitrate statin therapy as tolerated. May try coenzyme Q 10. Dietary and lifestyle changes recommended  Obesity Body mass index is 42.97 kg/m.Marland Kitchen Recommend aggressive weight loss through diet and increased physical activity, once cardiac workup is completed.   Signed, Wende Bushy, MD  11/13/2016 10:37 AM  [place]

## 2016-11-13 NOTE — Progress Notes (Signed)
Amber Ortiz at [place] NAME: Amber Ortiz    MR#:  [id number]  DATE OF BIRTH:  1961-03-12  SUBJECTIVE:   Pt. Here due to chest pain/pressure.  Still having some chest pressure.  No other complaints. Amber Ortiz at bedside. As per Amber Ortiz pt. Had very stressful day at work on Friday. CE X 3 have been (-).    REVIEW OF SYSTEMS:    Review of Systems  Constitutional: Negative for chills and fever.  HENT: Negative for congestion and tinnitus.   Eyes: Negative for blurred vision and double vision.  Respiratory: Negative for cough, shortness of breath and wheezing.   Cardiovascular: Positive for chest pain (chest pressure). Negative for orthopnea and PND.  Gastrointestinal: Negative for abdominal pain, diarrhea, nausea and vomiting.  Genitourinary: Negative for dysuria and hematuria.  Neurological: Negative for dizziness, sensory change and focal weakness.  All other systems reviewed and are negative.   Nutrition: Heart healthy Tolerating Diet: yes Tolerating PT: Ambulatory   DRUG ALLERGIES:   Allergies  Allergen Reactions  . Ciprocin-Fluocin-Procin [Fluocinolone] Rash  . Erythromycin Rash  . Penicillins Rash    Has patient had a PCN reaction causing immediate rash, facial/tongue/throat swelling, SOB or lightheadedness with hypotension: no Has patient had a PCN reaction causing severe rash involving mucus membranes or skin necrosis: no Has patient had a PCN reaction that required hospitalization no Has patient had a PCN reaction occurring within the last 10 years: no If all of the above answers are "NO", then may proceed with Cephalosporin use.    . Tetracyclines & Related Rash    VITALS:  Blood pressure 121/63, pulse 64, temperature 98 F (36.7 C), temperature source Oral, resp. rate 14, height 5' (1.524 m), weight 99.8 kg (220 lb), SpO2 91 %.  PHYSICAL EXAMINATION:   Physical Exam  GENERAL:  55 y.o.-year-old patient lying in the  bed with no acute distress.  EYES: Pupils equal, round, reactive to light and accommodation. No scleral icterus. Extraocular muscles intact.  HEENT: Head atraumatic, normocephalic. Oropharynx and nasopharynx clear.  NECK:  Supple, no jugular venous distention. No thyroid enlargement, no tenderness.  LUNGS: Normal breath sounds bilaterally, no wheezing, rales, rhonchi. No use of accessory muscles of respiration.  CARDIOVASCULAR: S1, S2 normal. No murmurs, rubs, or gallops.  ABDOMEN: Soft, nontender, nondistended. Bowel sounds present. No organomegaly or mass.  EXTREMITIES: No cyanosis, clubbing or edema b/l.    NEUROLOGIC: Cranial nerves II through XII are intact. No focal Motor or sensory deficits b/l.   PSYCHIATRIC: The patient is alert and oriented x 3.  SKIN: No obvious rash, lesion, or ulcer.    LABORATORY PANEL:   CBC  Recent Labs Lab 11/13/16 0418  WBC 4.8  HGB 13.0  HCT 37.0  PLT 145*   ------------------------------------------------------------------------------------------------------------------  Chemistries   Recent Labs Lab 11/12/16 1820 11/13/16 0418  NA 140 140  K 3.8 3.9  CL 106 108  CO2 26 26  GLUCOSE 158* 143*  BUN 17 17  CREATININE 1.03* 0.87  CALCIUM 9.6 9.1  AST 39  --   ALT 35  --   ALKPHOS 71  --   BILITOT 0.8  --    ------------------------------------------------------------------------------------------------------------------  Cardiac Enzymes  Recent Labs Lab 11/13/16 0418  TROPONINI <0.03   ------------------------------------------------------------------------------------------------------------------  RADIOLOGY:  Dg Chest 2 View  Result Date: 11/12/2016 CLINICAL DATA:  Patient with chest pain radiating to the back. EXAM: CHEST  2 VIEW COMPARISON:  Chest radiograph 11/09/2012.  FINDINGS: Normal cardiac and mediastinal contours. No consolidative pulmonary opacities. No pleural effusion or pneumothorax. Mid thoracic spine  degenerative changes. Cholecystectomy clips. IMPRESSION: No active cardiopulmonary disease. Electronically Signed   By: Lovey Newcomer M.D.   On: 11/12/2016 19:23     ASSESSMENT AND PLAN:   55 year old female with past medical history of HTN, psoriasis, anxiety who presents to the hospital due to chest pain/pressure.   1. Chest pain-patient continues to have ongoing chest pressure. Cardiac markers 3 of been negative. -EKG shows no acute ST or T wave changes. - cont. ASa, Metoprolol, Lisinopril, Pravachol, Nitro, Morphine.  - appreciate Cards input and plan for stress test in a.m. Tomorrow.   2. HTN - cont. Metoprolol, Lisinopril.  3. Anxiety - cont. PRN Xanax.   All the records are reviewed and case discussed with Care Management/Social Worker. Management plans discussed with the patient, family and they are in agreement.  CODE STATUS: Full  DVT Prophylaxis: Lovenox  TOTAL TIME TAKING CARE OF THIS PATIENT: 30 minutes.   POSSIBLE D/C IN 1-2 DAYS, DEPENDING ON CLINICAL CONDITION.   [name] M.D on 11/13/2016 at 1:27 PM  Between 7am to 6pm - Pager - [phone]  After 6pm go to [email or web address] - [employer] [facility]  Office  [phone]  CC: Primary care physician; No primary care provider on file.

## 2016-11-13 NOTE — Progress Notes (Signed)
Continues to have chest pressure but not pain.  Scheduled for stress test tomorrow.

## 2016-11-14 ENCOUNTER — Encounter: Payer: Self-pay | Admitting: Radiology

## 2016-11-14 ENCOUNTER — Observation Stay (HOSPITAL_BASED_OUTPATIENT_CLINIC_OR_DEPARTMENT_OTHER): Payer: BLUE CROSS/BLUE SHIELD

## 2016-11-14 DIAGNOSIS — R079 Chest pain, unspecified: Secondary | ICD-10-CM

## 2016-11-14 DIAGNOSIS — R0789 Other chest pain: Secondary | ICD-10-CM

## 2016-11-14 LAB — NM MYOCAR MULTI W/SPECT W/WALL MOTION / EF
CHL CUP NUCLEAR SDS: 5
CHL CUP NUCLEAR SSS: 6
CHL CUP RESTING HR STRESS: 95 {beats}/min
CSEPEW: 6.2 METS
CSEPPHR: 146 {beats}/min
Exercise duration (min): 4 min
Exercise duration (sec): 23 s
LV dias vol: 52 mL (ref 46–106)
LVSYSVOL: 22 mL
NUC STRESS TID: 1
Percent HR: 89 %
SRS: 1

## 2016-11-14 MED ORDER — TECHNETIUM TC 99M TETROFOSMIN IV KIT
31.3760 | PACK | Freq: Once | INTRAVENOUS | Status: AC | PRN
  Administered 2016-11-14: 31.376 via INTRAVENOUS

## 2016-11-14 MED ORDER — TECHNETIUM TC 99M TETROFOSMIN IV KIT
14.2100 | PACK | Freq: Once | INTRAVENOUS | Status: AC | PRN
  Administered 2016-11-14: 14.21 via INTRAVENOUS

## 2016-11-14 MED ORDER — ASPIRIN 81 MG PO TBEC: 81.0000 mg | DELAYED_RELEASE_TABLET | Freq: Every day | ORAL | 0 refills | Status: DC

## 2016-11-14 MED ORDER — ENOXAPARIN SODIUM 40 MG/0.4ML ~~LOC~~ SOLN: 40.0000 mg | Freq: Two times a day (BID) | SUBCUTANEOUS | Status: DC

## 2016-11-14 NOTE — Progress Notes (Signed)
Chest pressure much reduced. No nausea this morning.

## 2016-11-14 NOTE — Progress Notes (Signed)
Patient ID: Amber Ortiz, female   DOB: 1961/05/05, 55 y.o.   MRN: [id number]  [facility] PROGRESS NOTE  Amber Ortiz [id number] DOB: 21-Oct-1961 DOA: 11/12/2016 PCP: No primary care provider on file.  HPI/Subjective: Patient feeling well and offers no complaints.  Objective: Vitals:   11/14/16 1212 11/14/16 1240  BP: (!) 163/66 (!) 149/74  Pulse: 95 95  Resp:  19  Temp:  97.6 F (36.4 C)    Filed Weights   11/12/16 1815  Weight: 99.8 kg (220 lb)    ROS: Review of Systems  Constitutional: Negative for chills and fever.  Eyes: Negative for blurred vision.  Respiratory: Negative for cough and shortness of breath.   Cardiovascular: Negative for chest pain.  Gastrointestinal: Negative for abdominal pain, constipation, diarrhea, nausea and vomiting.  Genitourinary: Negative for dysuria.  Musculoskeletal: Negative for joint pain.  Neurological: Negative for dizziness and headaches.   Exam: Physical Exam  HENT:  Nose: No mucosal edema.  Mouth/Throat: No oropharyngeal exudate or posterior oropharyngeal edema.  Eyes: Conjunctivae, EOM and lids are normal. Pupils are equal, round, and reactive to light.  Neck: No JVD present. Carotid bruit is not present. No edema present. No thyroid mass and no thyromegaly present.  Cardiovascular: S1 normal and S2 normal.  Exam reveals no gallop.   No murmur heard. Pulses:      Dorsalis pedis pulses are 2+ on the right side, and 2+ on the left side.  Respiratory: No respiratory distress. She has no wheezes. She has no rhonchi. She has no rales.  GI: Soft. Bowel sounds are normal. There is no tenderness.  Musculoskeletal:       Right ankle: She exhibits no swelling.       Left ankle: She exhibits no swelling.  Lymphadenopathy:    She has no cervical adenopathy.  Neurological: She is alert. No cranial nerve deficit.  Skin: Skin is warm. No rash noted. Nails show no clubbing.  Psychiatric: She has a normal mood and affect.       Data Reviewed: Basic Metabolic Panel:  Recent Labs Lab 11/12/16 1820 11/13/16 0418  NA 140 140  K 3.8 3.9  CL 106 108  CO2 26 26  GLUCOSE 158* 143*  BUN 17 17  CREATININE 1.03* 0.87  CALCIUM 9.6 9.1   Liver Function Tests:  Recent Labs Lab 11/12/16 1820  AST 39  ALT 35  ALKPHOS 71  BILITOT 0.8  PROT 7.7  ALBUMIN 4.5   CBC:  Recent Labs Lab 11/12/16 1820 11/13/16 0418  WBC 6.2 4.8  HGB 14.9 13.0  HCT 42.6 37.0  MCV 81.9 80.6  PLT 179 145*   Cardiac Enzymes:  Recent Labs Lab 11/12/16 1820 11/12/16 2312 11/13/16 0220 11/13/16 0418  TROPONINI <0.03 <0.03 <0.03 <0.03     Studies: Dg Chest 2 View  Result Date: 11/12/2016 CLINICAL DATA:  Patient with chest pain radiating to the back. EXAM: CHEST  2 VIEW COMPARISON:  Chest radiograph 11/09/2012. FINDINGS: Normal cardiac and mediastinal contours. No consolidative pulmonary opacities. No pleural effusion or pneumothorax. Mid thoracic spine degenerative changes. Cholecystectomy clips. IMPRESSION: No active cardiopulmonary disease. Electronically Signed   By: Lovey Newcomer M.D.   On: 11/12/2016 19:23   Nm Myocar Multi W/spect W/wall Motion / Ef  Result Date: 11/14/2016  Blood pressure demonstrated a hypertensive response to exercise.  Downsloping ST segment depression ST segment depression of 0.5 mm was noted during stress in the II, III and aVF leads, and returning  to baseline after less than 1 minute of recovery.  T wave inversion was noted during stress in the V4, V5 and III leads.  The study is normal.  This is a low risk study.  The left ventricular ejection fraction is normal (55-65%).     Scheduled Meds: . aspirin EC  81 mg Oral Daily  . enoxaparin (LOVENOX) injection  40 mg Subcutaneous Q12H  . lisinopril  10 mg Oral Daily  . metoprolol succinate  50 mg Oral Daily  . mupirocin ointment  1 application Topical QID  . pravastatin  10 mg Oral QHS   Continuous Infusions: . sodium chloride  75 mL/hr at 11/14/16 1602    Assessment/Plan:  1. Chest pain. Stress test still pending. If negative will send home. If positive cardiology will have to set up for cardiac catheter tomorrow. 2. Essential hypertension continue lisinopril metoprolol. 3. Hyperlipidemia unspecified on pravastatin 4. History of psoriasis  Code Status:     Code Status Orders        Start     Ordered   11/12/16 2256  Full code  Continuous     11/12/16 2255    Code Status History    Date Active Date Inactive Code Status Order ID Comments User Context   This patient has a current code status but no historical code status.    Advance Directive Documentation   Flowsheet Row Most Recent Value  Type of Advance Directive  Healthcare Power of Attorney, Living will  Pre-existing out of facility DNR order (yellow form or pink MOST form)  No data  "MOST" Form in Place?  No data     Family Communication: Husband at the bedside Disposition Plan: Home today if stress test negative.  Consultants:  Cardiology  Time spent: 32 minutes  [place], [place]

## 2016-11-14 NOTE — Progress Notes (Signed)
Anticoagulation monitoring(Lovenox):  55yo  F ordered Lovenox 40 mg Q24h  Filed Weights   11/12/16 1815  Weight: 220 lb (99.8 kg)   BMI 43.1   Lab Results  Component Value Date   CREATININE 0.87 11/13/2016   CREATININE 1.03 (H) 11/12/2016   CREATININE 0.85 09/13/2013   Estimated Creatinine Clearance: 76.6 mL/min (by C-G formula based on SCr of 0.87 mg/dL). Hemoglobin & Hematocrit     Component Value Date/Time   HGB 13.0 11/13/2016 0418   HGB 13.2 09/13/2013 1414   HCT 37.0 11/13/2016 0418   HCT 37.7 09/13/2013 1414     Per Protocol for Patient with estCrcl > 30 ml/min and BMI > 40, will transition to Lovenox 40 mg Q12h.      Chinita Greenland PharmD Clinical Pharmacist 11/14/2016

## 2016-11-14 NOTE — Discharge Instructions (Signed)

## 2016-11-14 NOTE — Progress Notes (Signed)
   Patient is for nuclear stress test today. Results pending.

## 2016-11-14 NOTE — Progress Notes (Signed)
Stress test results came back as normal, discharging patient to home with husband.  Education about angina, heart dis risks and hi cholesterol/triglycerides.

## 2016-11-14 NOTE — Care Management (Signed)
Placed in observation for chest pain.  No routine home medications.  VSS.  Having stress test.

## 2016-11-14 NOTE — Discharge Summary (Signed)
[facility] [place] NAME: Amber Ortiz    MR#:  [id number]  DATE OF BIRTH:  11/13/1961  DATE OF ADMISSION:  11/12/2016 ADMITTING PHYSICIAN: Harvie Bridge, DO  DATE OF DISCHARGE: 11/14/2016  PRIMARY CARE PHYSICIAN: No primary care provider on file.    ADMISSION DIAGNOSIS:  Chest pain, unspecified type [R07.9]  DISCHARGE DIAGNOSIS:  Active Problems:   Chest pain, rule out acute myocardial infarction   Essential hypertension   Other hyperlipidemia   Morbid obesity ([place])   SECONDARY DIAGNOSIS:   Past Medical History:  Diagnosis Date  . Hypertension   . Psoriasis     HOSPITAL COURSE:   1. Chest pain unspecified. Stress test negative. Cardiac enzymes negative. Stable for discharge home 2. Essential hypertension continue her usual medications metoprolol and ACE inhibitor 3. Hyperlipidemia unspecified on pravastatin 4. Psoriasis. Continue usual medications at home  DISCHARGE CONDITIONS:   Satisfactory  CONSULTS OBTAINED:  Treatment Team:  Wende Bushy, MD  DRUG ALLERGIES:   Allergies  Allergen Reactions  . Ciprocin-Fluocin-Procin [Fluocinolone] Rash  . Erythromycin Rash  . Penicillins Rash    Has patient had a PCN reaction causing immediate rash, facial/tongue/throat swelling, SOB or lightheadedness with hypotension: no Has patient had a PCN reaction causing severe rash involving mucus membranes or skin necrosis: no Has patient had a PCN reaction that required hospitalization no Has patient had a PCN reaction occurring within the last 10 years: no If all of the above answers are "NO", then may proceed with Cephalosporin use.    . Tetracyclines & Related Rash    DISCHARGE MEDICATIONS:   Current Discharge Medication List    START taking these medications   Details  aspirin EC 81 MG EC tablet Take 1 tablet (81 mg total) by mouth daily. Qty: 30 tablet, Refills: 0      CONTINUE these medications which have  NOT CHANGED   Details  ammonium lactate (AMLACTIN) 12 % cream Apply 1 g topically as needed for dry skin.    escitalopram (LEXAPRO) 20 MG tablet Take 20 mg by mouth at bedtime.    Halcinonide (HALOG) 0.1 % CREA Apply 1 application topically daily as needed.    lisinopril (PRINIVIL,ZESTRIL) 10 MG tablet Take 1 tablet by mouth daily. Refills: 5    metoprolol succinate (TOPROL-XL) 50 MG 24 hr tablet Take 50 mg by mouth daily. Refills: 4    mupirocin ointment (BACTROBAN) 2 % Apply 1 application topically 4 (four) times daily. Apply to hands Refills: 0    pravastatin (PRAVACHOL) 10 MG tablet Take 1 tablet by mouth at bedtime. Refills: 11    Secukinumab (COSENTYX 300 DOSE) 150 MG/ML SOSY Inject 300 mg into the skin every 30 (thirty) days.    Vitamin D, Ergocalciferol, (DRISDOL) 50000 units CAPS capsule Take 1 capsule by mouth every 7 (seven) days. Refills: 5         DISCHARGE INSTRUCTIONS:   Follow-up with PMD one week  If you experience worsening of your admission symptoms, develop shortness of breath, life threatening emergency, suicidal or homicidal thoughts you must seek medical attention immediately by calling 911 or calling your MD immediately  if symptoms less severe.  You Must read complete instructions/literature along with all the possible adverse reactions/side effects for all the Medicines you take and that have been prescribed to you. Take any new Medicines after you have completely understood and accept all the possible adverse reactions/side effects.   Please note  You were  cared for by a hospitalist during your hospital stay. If you have any questions about your discharge medications or the care you received while you were in the hospital after you are discharged, you can call the unit and asked to speak with the hospitalist on call if the hospitalist that took care of you is not available. Once you are discharged, your primary care physician will handle any further  medical issues. Please note that NO REFILLS for any discharge medications will be authorized once you are discharged, as it is imperative that you return to your primary care physician (or establish a relationship with a primary care physician if you do not have one) for your aftercare needs so that they can reassess your need for medications and monitor your lab values.    Today   CHIEF COMPLAINT:   Chief Complaint  Patient presents with  . Chest Pain    HISTORY OF PRESENT ILLNESS:  Amber Ortiz  is a 55 y.o. female with a known history of hypertension presents with chest pain   VITAL SIGNS:  Blood pressure (!) 149/74, pulse 95, temperature 97.6 F (36.4 C), temperature source Oral, resp. rate 19, height 5' (1.524 m), weight 99.8 kg (220 lb), SpO2 94 %.   PHYSICAL EXAMINATION:  GENERAL:  55 y.o.-year-old patient lying in the bed with no acute distress.  EYES: Pupils equal, round, reactive to light and accommodation. No scleral icterus. Extraocular muscles intact.  HEENT: Head atraumatic, normocephalic. Oropharynx and nasopharynx clear.  NECK:  Supple, no jugular venous distention. No thyroid enlargement, no tenderness.  LUNGS: Normal breath sounds bilaterally, no wheezing, rales,rhonchi or crepitation. No use of accessory muscles of respiration.  CARDIOVASCULAR: S1, S2 normal. No murmurs, rubs, or gallops.  ABDOMEN: Soft, non-tender, non-distended. Bowel sounds present. No organomegaly or mass.  EXTREMITIES: No pedal edema, cyanosis, or clubbing.  NEUROLOGIC: Cranial nerves II through XII are intact. Muscle strength 5/5 in all extremities. Sensation intact. Gait not checked.  PSYCHIATRIC: The patient is alert and oriented x 3.  SKIN: No obvious rash, lesion, or ulcer.   DATA REVIEW:   CBC  Recent Labs Lab 11/13/16 0418  WBC 4.8  HGB 13.0  HCT 37.0  PLT 145*    Chemistries   Recent Labs Lab 11/12/16 1820 11/13/16 0418  NA 140 140  K 3.8 3.9  CL 106 108   CO2 26 26  GLUCOSE 158* 143*  BUN 17 17  CREATININE 1.03* 0.87  CALCIUM 9.6 9.1  AST 39  --   ALT 35  --   ALKPHOS 71  --   BILITOT 0.8  --     Cardiac Enzymes  Recent Labs Lab 11/13/16 0418  TROPONINI <0.03      RADIOLOGY:  Dg Chest 2 View  Result Date: 11/12/2016 CLINICAL DATA:  Patient with chest pain radiating to the back. EXAM: CHEST  2 VIEW COMPARISON:  Chest radiograph 11/09/2012. FINDINGS: Normal cardiac and mediastinal contours. No consolidative pulmonary opacities. No pleural effusion or pneumothorax. Mid thoracic spine degenerative changes. Cholecystectomy clips. IMPRESSION: No active cardiopulmonary disease. Electronically Signed   By: Lovey Newcomer M.D.   On: 11/12/2016 19:23   Nm Myocar Multi W/spect W/wall Motion / Ef  Result Date: 11/14/2016  Blood pressure demonstrated a hypertensive response to exercise.  Downsloping ST segment depression ST segment depression of 0.5 mm was noted during stress in the II, III and aVF leads, and returning to baseline after less than 1 minute of recovery.  T  wave inversion was noted during stress in the V4, V5 and III leads.  The study is normal.  This is a low risk study.  The left ventricular ejection fraction is normal (55-65%).       Management plans discussed with the patient, family and they are in agreement.  CODE STATUS:     Code Status Orders        Start     Ordered   11/12/16 2256  Full code  Continuous     11/12/16 2255    Code Status History    Date Active Date Inactive Code Status Order ID Comments User Context   This patient has a current code status but no historical code status.    Advance Directive Documentation   Flowsheet Row Most Recent Value  Type of Advance Directive  Healthcare Power of Attorney, Living will  Pre-existing out of facility DNR order (yellow form or pink MOST form)  No data  "MOST" Form in Place?  No data      TOTAL TIME TAKING CARE OF THIS PATIENT: 32 minutes.     Loletha Grayer M.D on 11/14/2016 at 5:16 PM  Between 7am to 6pm - Pager - [phone]  After 6pm go to [email or web address] - password [email or web address]  [facility] Office  [phone]  CC: Primary care physician; No primary care provider on file.

## 2017-01-13 ENCOUNTER — Other Ambulatory Visit: Payer: Self-pay | Admitting: Family Medicine

## 2017-01-13 DIAGNOSIS — L409 Psoriasis, unspecified: Secondary | ICD-10-CM | POA: Insufficient documentation

## 2017-01-13 DIAGNOSIS — Z1231 Encounter for screening mammogram for malignant neoplasm of breast: Secondary | ICD-10-CM

## 2017-05-23 ENCOUNTER — Ambulatory Visit
Admission: RE | Admit: 2017-05-23 | Discharge: 2017-05-23 | Disposition: A | Discharge status: Home / Self Care | Payer: BLUE CROSS/BLUE SHIELD | Source: Ambulatory Visit | Attending: Family Medicine | Admitting: Family Medicine

## 2017-05-23 DIAGNOSIS — Z1231 Encounter for screening mammogram for malignant neoplasm of breast: Secondary | ICD-10-CM | POA: Insufficient documentation

## 2017-11-08 ENCOUNTER — Emergency Department
Admission: EM | Admit: 2017-11-08 | Discharge: 2017-11-09 | Disposition: A | Discharge status: Home / Self Care | Payer: BLUE CROSS/BLUE SHIELD | Attending: Emergency Medicine | Admitting: Emergency Medicine

## 2017-11-08 ENCOUNTER — Emergency Department: Payer: BLUE CROSS/BLUE SHIELD

## 2017-11-08 ENCOUNTER — Encounter: Payer: Self-pay | Admitting: Emergency Medicine

## 2017-11-08 DIAGNOSIS — Z7982 Long term (current) use of aspirin: Secondary | ICD-10-CM | POA: Insufficient documentation

## 2017-11-08 DIAGNOSIS — Z87891 Personal history of nicotine dependence: Secondary | ICD-10-CM | POA: Insufficient documentation

## 2017-11-08 DIAGNOSIS — Z79899 Other long term (current) drug therapy: Secondary | ICD-10-CM | POA: Diagnosis not present

## 2017-11-08 DIAGNOSIS — N201 Calculus of ureter: Secondary | ICD-10-CM | POA: Diagnosis not present

## 2017-11-08 DIAGNOSIS — R109 Unspecified abdominal pain: Secondary | ICD-10-CM | POA: Diagnosis present

## 2017-11-08 DIAGNOSIS — I10 Essential (primary) hypertension: Secondary | ICD-10-CM | POA: Insufficient documentation

## 2017-11-08 LAB — BASIC METABOLIC PANEL
Anion gap: 9 (ref 5–15)
BUN: 19 mg/dL (ref 6–20)
CALCIUM: 9.4 mg/dL (ref 8.9–10.3)
CHLORIDE: 102 mmol/L (ref 101–111)
CO2: 27 mmol/L (ref 22–32)
CREATININE: 1.19 mg/dL — AB (ref 0.44–1.00)
GFR calc Af Amer: 58 mL/min — ABNORMAL LOW (ref >60)
GFR calc non Af Amer: 50 mL/min — ABNORMAL LOW (ref >60)
Glucose, Bld: 162 mg/dL — ABNORMAL HIGH (ref 65–99)
POTASSIUM: 3.6 mmol/L (ref 3.5–5.1)
SODIUM: 138 mmol/L (ref 135–145)

## 2017-11-08 LAB — URINALYSIS, COMPLETE (UACMP) WITH MICROSCOPIC
BILIRUBIN URINE: NEGATIVE
GLUCOSE, UA: NEGATIVE mg/dL
Ketones, ur: NEGATIVE mg/dL
LEUKOCYTES UA: NEGATIVE
NITRITE: NEGATIVE
PH: 5 (ref 5.0–8.0)
Protein, ur: 30 mg/dL — AB
SPECIFIC GRAVITY, URINE: 1.021 (ref 1.005–1.030)

## 2017-11-08 LAB — CBC
HCT: 38.5 % (ref 35.0–47.0)
HEMOGLOBIN: 13.5 g/dL (ref 12.0–16.0)
MCH: 28.5 pg (ref 26.0–34.0)
MCHC: 35 g/dL (ref 32.0–36.0)
MCV: 81.5 fL (ref 80.0–100.0)
Platelets: 185 10*3/uL (ref 150–440)
RBC: 4.72 MIL/uL (ref 3.80–5.20)
RDW: 13.9 % (ref 11.5–14.5)
WBC: 7.9 10*3/uL (ref 3.6–11.0)

## 2017-11-08 MED ORDER — MORPHINE SULFATE (PF) 2 MG/ML IV SOLN
2.0000 mg | Freq: Once | INTRAVENOUS | Status: AC
  Administered 2017-11-08: 2 mg via INTRAVENOUS
  Filled 2017-11-08: qty 1

## 2017-11-08 MED ORDER — NAPROXEN 500 MG PO TABS: 500.0000 mg | ORAL_TABLET | Freq: Two times a day (BID) | ORAL | 0 refills | Status: AC

## 2017-11-08 MED ORDER — KETOROLAC TROMETHAMINE 30 MG/ML IJ SOLN
30.0000 mg | Freq: Once | INTRAMUSCULAR | Status: AC
  Administered 2017-11-08: 30 mg via INTRAVENOUS

## 2017-11-08 MED ORDER — KETOROLAC TROMETHAMINE 30 MG/ML IJ SOLN
INTRAMUSCULAR | Status: AC
  Filled 2017-11-08: qty 1

## 2017-11-08 MED ORDER — TAMSULOSIN HCL 0.4 MG PO CAPS: 0.4000 mg | ORAL_CAPSULE | Freq: Every day | ORAL | 0 refills | Status: DC

## 2017-11-08 MED ORDER — OXYCODONE-ACETAMINOPHEN 5-325 MG PO TABS: 1.0000 | ORAL_TABLET | Freq: Four times a day (QID) | ORAL | 0 refills | Status: DC | PRN

## 2017-11-08 MED ORDER — SODIUM CHLORIDE 0.9 % IV BOLUS (SEPSIS)
1000.0000 mL | Freq: Once | INTRAVENOUS | Status: AC
  Administered 2017-11-08: 1000 mL via INTRAVENOUS

## 2017-11-08 MED ORDER — ONDANSETRON HCL 4 MG/2ML IJ SOLN
4.0000 mg | Freq: Once | INTRAMUSCULAR | Status: AC
  Administered 2017-11-08: 4 mg via INTRAVENOUS

## 2017-11-08 MED ORDER — ONDANSETRON HCL 4 MG/2ML IJ SOLN
INTRAMUSCULAR | Status: AC
  Filled 2017-11-08: qty 2

## 2017-11-08 NOTE — ED Provider Notes (Signed)
Ct Renal Stone Study  Result Date: 11/08/2017 CLINICAL DATA:  Left-sided flank pain radiating to the groin EXAM: CT ABDOMEN AND PELVIS WITHOUT CONTRAST TECHNIQUE: Multidetector CT imaging of the abdomen and pelvis was performed following the standard protocol without IV contrast. COMPARISON:  CT abdomen pelvis 03/06/2014 FINDINGS: Lower chest: No pulmonary nodules or pleural effusion. No visible pericardial effusion. Hepatobiliary: There is diffuse hypoattenuation of the liver, consistent with hepatic steatosis. Status post cholecystectomy. Pancreas: Pancreatic contours are normal. No abnormal calcifications or mass lesions. No peripancreatic fluid collection. No pancreatic ductal dilatation. Spleen: Mild splenomegaly measuring 13.3 cm in AP dimension. Otherwise normal. Adrenals/Urinary Tract: --Adrenal glands: Normal. --Right kidney/ureter: No hydronephrosis or perinephric stranding. No nephrolithiasis. No obstructing ureteral stones. --Left kidney/ureter: There is mild left hydronephrosis with minimal perinephric stranding. Mild hydroureter. There is an obstructing stone just proximal to the left ureterovesical junction that measures 6 x 4 x 7 mm. --Urinary bladder: Unremarkable. Stomach/Bowel: --Stomach/Duodenum: No hiatal hernia or other gastric abnormality. Normal duodenal course and caliber. --Small bowel: No dilatation or inflammation. --Colon: No focal abnormality. --Appendix: Not visualized. No right lower quadrant inflammation or free fluid. Vascular/Lymphatic: Atherosclerotic calcification is present within the non-aneurysmal abdominal aorta, without hemodynamically significant stenosis. No abdominal or pelvic lymphadenopathy. Reproductive: Status post hysterectomy. No adnexal mass. Musculoskeletal. No bony spinal canal stenosis or focal osseous abnormality. Other: None. IMPRESSION: 1. Left-sided obstructive uropathy with 6 x 4 x 7 mm stone just proximal to the left ureterovesical junction, causing low  mild left hydroureteronephrosis. No other nephrolithiasis. 2. Hepatic steatosis and borderline splenomegaly. 3.  Aortic Atherosclerosis (ICD10-I70.0). Electronically Signed   By: Ulyses Jarred M.D.   On: 11/08/2017 22:08     Labs Reviewed  URINALYSIS, COMPLETE (UACMP) WITH MICROSCOPIC - Abnormal; Notable for the following components:      Result Value   Color, Urine YELLOW (*)    APPearance HAZY (*)    Hgb urine dipstick LARGE (*)    Protein, ur 30 (*)    Bacteria, UA RARE (*)    Squamous Epithelial / LPF 0-5 (*)    All other components within normal limits  BASIC METABOLIC PANEL - Abnormal; Notable for the following components:   Glucose, Bld 162 (*)    Creatinine, Ser 1.19 (*)    GFR calc non Af Amer 50 (*)    GFR calc Af Amer 58 (*)    All other components within normal limits  CBC     Clinical Course as of Nov 09 140  Wed Nov 08, 2017  2313 Pending UA.  Signed out to Dr. Karma Greaser; if no acute findings of UTI will proceed with d/c home.    [SS]  2324 Assuming care from Dr. Cherylann Banas.  In short, Amber Ortiz is a 56 y.o. female with a chief complaint of kidney stone.  Refer to the original H&P for additional details.  The current plan of care is to follow up urinalysis.   [CF]  Thu Nov 09, 2017  0141 Urinalysis was reassuring with no sign of infection.  The patient's pain is well controlled and she is ready to go home.  I discharged as per Dr. Kennith Gain instructions  [CF]    Clinical Course User Index [CF] Hinda Kehr, MD [SS] Arta Silence, MD     Final diagnoses:  Amber Pian, MD 11/09/17 [phone]

## 2017-11-08 NOTE — ED Provider Notes (Addendum)
[facility] Emergency Department Provider Note ____________________________________________   First MD Initiated Contact with Patient 11/08/17 2111     (approximate)  I have reviewed the triage vital signs and the nursing notes.   HISTORY  Chief Complaint Flank Pain    HPI Amber Ortiz is a 56 y.o. female history of hypertension psoriasis who presents with left flank pain, acute onset 1 hour ago, severe intensity, radiating to the groin, and associated with nausea and several episodes of vomiting.  Patient reports some urinary frequency yesterday but no other urinary symptoms.  She denies fever chills, abdominal pain, diarrhea, or vaginal bleeding.  No prior history of this pain.  No kidney stones in the past.   Past Medical History:  Diagnosis Date  . Hypertension   . Psoriasis     Patient Active Problem List   Diagnosis Date Noted  . Essential hypertension   . Other hyperlipidemia   . Morbid obesity ([place])   . Chest pain, rule out acute myocardial infarction 11/12/2016    Past Surgical History:  Procedure Laterality Date  . ABDOMINAL HYSTERECTOMY      Prior to Admission medications   Medication Sig Start Date End Date Taking? Authorizing Provider  ammonium lactate (AMLACTIN) 12 % cream Apply 1 g topically as needed for dry skin.    [provider]  aspirin EC 81 MG EC tablet Take 1 tablet (81 mg total) by mouth daily. 11/15/16   Loletha Grayer, MD  escitalopram (LEXAPRO) 20 MG tablet Take 20 mg by mouth at bedtime.    [provider]  Halcinonide (HALOG) 0.1 % CREA Apply 1 application topically daily as needed.    [provider]  lisinopril (PRINIVIL,ZESTRIL) 10 MG tablet Take 1 tablet by mouth daily. 09/19/16   [provider]  metoprolol succinate (TOPROL-XL) 50 MG 24 hr tablet Take 50 mg by mouth daily. 09/23/16   [provider]  mupirocin ointment (BACTROBAN) 2 % Apply 1 application  topically 4 (four) times daily. Apply to hands 10/27/16   [provider]  naproxen (NAPROSYN) 500 MG tablet Take 1 tablet (500 mg total) by mouth 2 (two) times daily with a meal for 15 days. 11/08/17 11/23/17  Arta Silence, MD  oxyCODONE-acetaminophen (ROXICET) 5-325 MG tablet Take 1 tablet by mouth every 6 (six) hours as needed for severe pain. 11/08/17 12/08/17  Arta Silence, MD  pravastatin (PRAVACHOL) 10 MG tablet Take 1 tablet by mouth at bedtime. 09/24/16   [provider]  Secukinumab (COSENTYX 300 DOSE) 150 MG/ML SOSY Inject 300 mg into the skin every 30 (thirty) days.    [provider]  tamsulosin (FLOMAX) 0.4 MG CAPS capsule Take 1 capsule (0.4 mg total) by mouth daily after supper. 11/09/17   Arta Silence, MD  Vitamin D, Ergocalciferol, (DRISDOL) 50000 units CAPS capsule Take 1 capsule by mouth every 7 (seven) days. 09/22/16   [provider]    Allergies Ibuprofen; Ciprocin-fluocin-procin [fluocinolone]; Erythromycin; Penicillins; and Tetracyclines & related  Family History  Problem Relation Age of Onset  . Breast cancer Sister 76    Social History Social History   Tobacco Use  . Smoking status: Former Smoker    Last attempt to quit: 12/19/1986    Years since quitting: 30.9  . Smokeless tobacco: Never Used  Substance Use Topics  . Alcohol use: No    Frequency: Never  . Drug use: No    Review of Systems  Constitutional: No fever. Eyes:  No redness. ENT: No neck pain. Cardiovascular: Denies chest pain. Respiratory: Denies shortness of breath. Gastrointestinal: Positive for nausea and vomiting Genitourinary: Negative for dysuria or hematuria.  Musculoskeletal: Positive for back pain. Skin: Negative for rash. Neurological: Negative for headaches, focal weakness or numbness.   ____________________________________________   PHYSICAL EXAM:  VITAL SIGNS: ED Triage Vitals  Enc Vitals Group     BP 11/08/17 2110  (!) 179/73     Pulse Rate 11/08/17 2110 77     Resp 11/08/17 2110 20     Temp 11/08/17 2110 97.7 F (36.5 C)     Temp Source 11/08/17 2110 Oral     SpO2 11/08/17 2110 97 %     Weight 11/08/17 2111 200 lb (90.7 kg)     Height 11/08/17 2111 5' (1.524 m)     Head Circumference --      Peak Flow --      Pain Score 11/08/17 2110 10     Pain Loc --      Pain Edu? --      Excl. in [place]? --     Constitutional: Alert and oriented.  Uncomfortable appearing but in no acute distress. Eyes: Conjunctivae are normal.  Head: Atraumatic. Nose: No congestion/rhinnorhea. Mouth/Throat: Mucous membranes are moist.   Neck: Normal range of motion.  Cardiovascular:  Good peripheral circulation. Respiratory: Normal respiratory effort.  No retractions.  Gastrointestinal: Soft and nontender. No distention.  Genitourinary: No CVA tenderness.  Mild left flank tenderness. Musculoskeletal:  Extremities warm and well perfused.  Neurologic:  Normal speech and language. No gross focal neurologic deficits are appreciated.  Skin:  Skin is warm and dry. No rash noted. Psychiatric: Mood and affect are normal. Speech and behavior are normal.  ____________________________________________   LABS (all labs ordered are listed, but only abnormal results are displayed)  Labs Reviewed  BASIC METABOLIC PANEL - Abnormal; Notable for the following components:      Result Value   Glucose, Bld 162 (*)    Creatinine, Ser 1.19 (*)    GFR calc non Af Amer 50 (*)    GFR calc Af Amer 58 (*)    All other components within normal limits  CBC  URINALYSIS, COMPLETE (UACMP) WITH MICROSCOPIC   ____________________________________________  EKG   ____________________________________________  RADIOLOGY  CT abd: 6x4x50mm ureteral stone at L UVJ with mild hydronephrosis  ____________________________________________   PROCEDURES  Procedure(s) performed: No    Critical Care performed:  No ____________________________________________   INITIAL IMPRESSION / ASSESSMENT AND PLAN / ED COURSE  Pertinent labs & imaging results that were available during my care of the patient were reviewed by me and considered in my medical decision making (see chart for details).  56 year old female with a history of hypertension and psoriasis presents with acute onset of left flank pain radiating to the groin over the last hour, associated with nausea and vomiting.  On exam, patient is uncomfortable but not acutely ill-appearing.  She is hypertensive, likely due to acute pain, but other vital signs are normal.  There is mild left flank tenderness, but otherwise no significant exam findings.  Review of past medical records in Epic is noncontributory.  Overall differential most likely ureteral stone, versus pyelonephritis.  Lower suspicion for musculoskeletal pain, or other intra-abdominal cause such as diverticulitis.  I do not suspect gynecologic cause due to the location of the pain and patient's age.  Plan: Basic labs, UA, CT abdomen given first time stone, and analgesia.  Clinical Course  as of Nov 09 2315  Wed Nov 08, 2017  2313 Pending UA.  Signed out to Dr. Karma Greaser; if no acute findings of UTI will proceed with d/c home.    [SS]    Clinical Course User Index [SS] Arta Silence, MD   ----------------------------------------- 10:50 PM on 11/08/2017 -----------------------------------------  Patient's pain is well controlled with Toradol and low-dose of morphine.  She is comfortable appearing.  The CT reveals a relatively large stone, however it is near the UVJ.  Given that patient's pain is relatively easily controlled and her creatinine is normal, she does not require acute intervention or emergent urologic consultation.  She is still pending the UA, however if this shows no concerning findings and patient's pain remains well controlled, I will discharge with urology referral and  return precautions.  I explained the anticipated plan and return precautions to patient, and she expresses understanding.  I answered her and her husband's questions.  ____________________________________________   FINAL CLINICAL IMPRESSION(S) / ED DIAGNOSES  Final diagnoses:  Ureterolithiasis      NEW MEDICATIONS STARTED DURING THIS VISIT:  This SmartLink is deprecated. Use AVSMEDLIST instead to display the medication list for a patient.   Note:  This document was prepared using Dragon voice recognition software and may include unintentional dictation errors.    Arta Silence, MD 11/08/17 2252    Arta Silence, MD 11/08/17 2316

## 2017-11-08 NOTE — ED Triage Notes (Signed)
Pt comes into the ED via POV c/o left side flank pain that started an hour ago.  Patient is also having a hard time urinating.  Denies any h/o kidney stones.  Patient has nausea and is writhing in pain at this time.  Patient has even and unlabored respirations at this time.

## 2017-11-08 NOTE — Discharge Instructions (Signed)
Return to the emergency department immediately for new or worsening pain, pain not controlled by the pain medications prescribed, fevers, weakness, or any other new or worsening symptoms that concern you.  You should follow-up with the urologist within the next week.  A referral has been provided.

## 2017-11-08 NOTE — ED Notes (Signed)
Report off to rebecca rn 

## 2017-11-08 NOTE — ED Notes (Signed)
Pt has sudden onset of left flank pain.  Pt reports n/v.  No hx kidney stones.  Pt also reports diff urinating today.  Pt unable to void at this time.  md at bedsiide.  Iv started and meds given.  Family with pt.

## 2017-11-08 NOTE — ED Triage Notes (Signed)
Pt presents to ED with sudden onset of left sided flank pain that radiates around to her groin approx 1 hour ago. Pt has been unable to void. +vomiting. Pt restless and c/o nausea. Emesis bag provided.

## 2017-11-16 ENCOUNTER — Ambulatory Visit: Payer: BLUE CROSS/BLUE SHIELD | Admitting: Urology

## 2017-11-16 ENCOUNTER — Encounter: Payer: Self-pay | Admitting: Urology

## 2017-11-16 VITALS — BP 121/74 | HR 81 | Ht 60.0 in | Wt 220.0 lb

## 2017-11-16 DIAGNOSIS — N200 Calculus of kidney: Secondary | ICD-10-CM

## 2017-11-16 NOTE — Progress Notes (Signed)
11/16/2017 12:01 PM   Amber Ortiz [birth year] [id number]  Referring provider: [name], MD [place], [place] [address]  Chief Complaint  Patient presents with  . New Patient (Initial Visit)  . Nephrolithiasis    HPI: The patient is a 56 year old female who presents today for evaluation after being diagnosed with a 5 mm in width by 7 mm in length stone near the left UVJ.  There was no other nephrolithiasis.  She initially presented to the ED with left flank pain and nausea and vomiting.  This resolved and she was discharged home.  She currently is asymptomatic.  She did not have a strainer but has been urinating into a cup and has not seen the stone.  She currently would like to try pass the stone as she is not having any problems.  She has no previous stone history.  However, her husband and daughter have significant history of nephrolithiasis that she is very familiar with the process of passing a stone as well as treatment options.   PMH: Past Medical History:  Diagnosis Date  . Anxiety   . Arrhythmia   . Hypertension   . Kidney stone   . Psoriasis     Surgical History: Past Surgical History:  Procedure Laterality Date  . ABDOMINAL HYSTERECTOMY    . APPENDECTOMY    . CESAREAN SECTION    . CHOLECYSTECTOMY      Home Medications:  Allergies as of 11/16/2017      Reactions   Ibuprofen    Cephalexin Rash   Ciprocin-fluocin-procin [fluocinolone] Rash   Erythromycin Rash   Erythromycin Ethylsuccinate Rash   Penicillins Rash   Has patient had a PCN reaction causing immediate rash, facial/tongue/throat swelling, SOB or lightheadedness with hypotension: no Has patient had a PCN reaction causing severe rash involving mucus membranes or skin necrosis: no Has patient had a PCN reaction that required hospitalization no Has patient had a PCN reaction occurring within the last 10 years: no If all of the above answers are "NO", then may proceed with  Cephalosporin use.   Tetracyclines & Related Rash      Medication List        Accurate as of 11/16/17 12:01 PM. Always use your most recent med list.          ammonium lactate 12 % cream Commonly known as:  AMLACTIN Apply 1 g topically as needed for dry skin.   clonazePAM 0.5 MG tablet Commonly known as:  KLONOPIN TAKE ONE TABLET BY MOUTH AT BEDTIME   COSENTYX 300 DOSE 150 MG/ML Sosy Generic drug:  Secukinumab Inject 300 mg into the skin every 30 (thirty) days.   escitalopram 20 MG tablet Commonly known as:  LEXAPRO Take 20 mg by mouth at bedtime.   HALOG 0.1 % Crea Generic drug:  Halcinonide Apply 1 application topically daily as needed.   lisinopril 10 MG tablet Commonly known as:  PRINIVIL,ZESTRIL Take 1 tablet by mouth daily.   metoprolol succinate 50 MG 24 hr tablet Commonly known as:  TOPROL-XL Take 50 mg by mouth daily.   mupirocin ointment 2 % Commonly known as:  BACTROBAN Apply 1 application topically 4 (four) times daily. Apply to hands   naproxen 500 MG tablet Commonly known as:  NAPROSYN Take 1 tablet (500 mg total) by mouth 2 (two) times daily with a meal for 15 days.   oxyCODONE-acetaminophen 5-325 MG tablet Commonly known as:  ROXICET Take 1 tablet by mouth  every 6 (six) hours as needed for severe pain.   tamsulosin 0.4 MG Caps capsule Commonly known as:  FLOMAX Take 1 capsule (0.4 mg total) by mouth daily after supper.   Vitamin D (Ergocalciferol) 50000 units Caps capsule Commonly known as:  DRISDOL Take 1 capsule by mouth every 7 (seven) days.       Allergies:  Allergies  Allergen Reactions  . Ibuprofen   . Cephalexin Rash  . Ciprocin-Fluocin-Procin [Fluocinolone] Rash  . Erythromycin Rash  . Erythromycin Ethylsuccinate Rash  . Penicillins Rash    Has patient had a PCN reaction causing immediate rash, facial/tongue/throat swelling, SOB or lightheadedness with hypotension: no Has patient had a PCN reaction causing severe rash  involving mucus membranes or skin necrosis: no Has patient had a PCN reaction that required hospitalization no Has patient had a PCN reaction occurring within the last 10 years: no If all of the above answers are "NO", then may proceed with Cephalosporin use.    . Tetracyclines & Related Rash    Family History: Family History  Problem Relation Age of Onset  . Breast cancer Sister 39  . Kidney cancer Neg Hx   . Kidney disease Neg Hx   . Prostate cancer Neg Hx     Social History:  reports that she quit smoking about 30 years ago. she has never used smokeless tobacco. She reports that she does not drink alcohol or use drugs.  ROS: UROLOGY Frequent Urination?: Yes Hard to postpone urination?: No Burning/pain with urination?: Yes Get up at night to urinate?: Yes Leakage of urine?: Yes Urine stream starts and stops?: No Trouble starting stream?: No Do you have to strain to urinate?: No Blood in urine?: No Urinary tract infection?: No Sexually transmitted disease?: No Injury to kidneys or bladder?: No Painful intercourse?: No Weak stream?: No Currently pregnant?: No Vaginal bleeding?: No Last menstrual period?: n  Gastrointestinal Nausea?: Yes Vomiting?: Yes Indigestion/heartburn?: No Diarrhea?: No Constipation?: No  Constitutional Fever: No Night sweats?: No Weight loss?: No Fatigue?: No  Skin Skin rash/lesions?: No Itching?: No  Eyes Blurred vision?: No Double vision?: No  Ears/Nose/Throat Sore throat?: No Sinus problems?: No  Hematologic/Lymphatic Swollen glands?: No Easy bruising?: No  Cardiovascular Leg swelling?: No Chest pain?: No  Respiratory Cough?: No Shortness of breath?: No  Endocrine Excessive thirst?: No  Musculoskeletal Back pain?: No Joint pain?: No  Neurological Headaches?: No Dizziness?: No  Psychologic Depression?: No Anxiety?: Yes  Physical Exam: BP 121/74   Pulse 81   Ht 5' (1.524 m)   Wt 220 lb (99.8 kg)    BMI 42.97 kg/m   Constitutional:  Alert and oriented, No acute distress. HEENT: [place] AT, moist mucus membranes.  Trachea midline, no masses. Cardiovascular: No clubbing, cyanosis, or edema. Respiratory: Normal respiratory effort, no increased work of breathing. GI: Abdomen is soft, nontender, nondistended, no abdominal masses GU: No CVA tenderness.  Skin: No rashes, bruises or suspicious lesions. Lymph: No cervical or inguinal adenopathy. Neurologic: Grossly intact, no focal deficits, moving all 4 extremities. Psychiatric: Normal mood and affect.  Laboratory Data: Lab Results  Component Value Date   WBC 7.9 11/08/2017   HGB 13.5 11/08/2017   HCT 38.5 11/08/2017   MCV 81.5 11/08/2017   PLT 185 11/08/2017    Lab Results  Component Value Date   CREATININE 1.19 (H) 11/08/2017    No results found for: PSA  No results found for: TESTOSTERONE  No results found for: HGBA1C  Urinalysis  Component Value Date/Time   COLORURINE YELLOW (A) 11/08/2017 2308   APPEARANCEUR HAZY (A) 11/08/2017 2308   APPEARANCEUR Clear 09/13/2013 1414   LABSPEC 1.021 11/08/2017 2308   LABSPEC 1.021 09/13/2013 1414   PHURINE 5.0 11/08/2017 2308   GLUCOSEU NEGATIVE 11/08/2017 2308   GLUCOSEU Negative 09/13/2013 1414   HGBUR LARGE (A) 11/08/2017 2308   BILIRUBINUR NEGATIVE 11/08/2017 2308   BILIRUBINUR Negative 09/13/2013 [place] 11/08/2017 2308   PROTEINUR 30 (A) 11/08/2017 2308   NITRITE NEGATIVE 11/08/2017 2308   LEUKOCYTESUR NEGATIVE 11/08/2017 2308   LEUKOCYTESUR Negative 09/13/2013 1414    Pertinent Imaging: CT images personally reviewed as above with solitary left UVJ stone.  Assessment & Plan:    1. Left UVJ stone I discussed the patient treatment options for her left UVJ stone which includes medical expulsive therapy, lithotripsy, ureteroscopy.  She is asymptomatic at this point, she would like to continue to try to pass the stone.  Given her strainer.  She will  continue Flomax.  We will have her follow-up in 2 weeks with a KUB prior last she can bring [place] her stone before that time.  Return in about 2 weeks (around 11/30/2017) for KUB prior.  Nickie Retort, MD  [facility] [address] [place], [place] [address] [phone]

## 2017-11-30 ENCOUNTER — Ambulatory Visit: Payer: BLUE CROSS/BLUE SHIELD | Admitting: Urology

## 2017-11-30 ENCOUNTER — Encounter: Payer: Self-pay | Admitting: Urology

## 2017-11-30 VITALS — BP 132/74 | HR 68 | Ht 60.0 in | Wt 237.6 lb

## 2017-11-30 DIAGNOSIS — N200 Calculus of kidney: Secondary | ICD-10-CM | POA: Diagnosis not present

## 2017-11-30 NOTE — Progress Notes (Signed)
11/30/2017 9:57 AM   Amber Ortiz December 09, 1961 [id number]  Referring provider: [name], MD [place], [place] [address]  Chief Complaint  Patient presents with  . Nephrolithiasis    HPI: The patient is a 56 year old female who presents today for evaluation after being diagnosed with a 5 mm in width by 7 mm in length stone near the left UVJ.  There was no other nephrolithiasis.  She initially presented to the ED with left flank pain and nausea and vomiting.  This resolved and she was discharged home.  She currently is asymptomatic.   She has no previous stone history.  However, her husband and daughter have significant history of nephrolithiasis that she is very familiar with the process of passing a stone as well as treatment options.  Since she was last seen, she did pass 2 stones consistent with the stone burden seen on recent CT scan.      PMH: Past Medical History:  Diagnosis Date  . Anxiety   . Arrhythmia   . Hypertension   . Kidney stone   . Psoriasis     Surgical History: Past Surgical History:  Procedure Laterality Date  . ABDOMINAL HYSTERECTOMY    . APPENDECTOMY    . CESAREAN SECTION    . CHOLECYSTECTOMY      Home Medications:  Allergies as of 11/30/2017      Reactions   Ibuprofen    Cephalexin Rash   Ciprocin-fluocin-procin [fluocinolone] Rash   Erythromycin Rash   Erythromycin Ethylsuccinate Rash   Penicillins Rash   Has patient had a PCN reaction causing immediate rash, facial/tongue/throat swelling, SOB or lightheadedness with hypotension: no Has patient had a PCN reaction causing severe rash involving mucus membranes or skin necrosis: no Has patient had a PCN reaction that required hospitalization no Has patient had a PCN reaction occurring within the last 10 years: no If all of the above answers are "NO", then may proceed with Cephalosporin use.   Tetracyclines & Related Rash      Medication List        Accurate as  of 11/30/17  9:57 AM. Always use your most recent med list.          ammonium lactate 12 % cream Commonly known as:  AMLACTIN Apply 1 g topically as needed for dry skin.   clonazePAM 0.5 MG tablet Commonly known as:  KLONOPIN TAKE ONE TABLET BY MOUTH AT BEDTIME   COSENTYX 300 DOSE 150 MG/ML Sosy Generic drug:  Secukinumab Inject 300 mg into the skin every 30 (thirty) days.   escitalopram 20 MG tablet Commonly known as:  LEXAPRO Take 20 mg by mouth at bedtime.   HALOG 0.1 % Crea Generic drug:  Halcinonide Apply 1 application topically daily as needed.   lisinopril 10 MG tablet Commonly known as:  PRINIVIL,ZESTRIL Take 1 tablet by mouth daily.   metoprolol succinate 50 MG 24 hr tablet Commonly known as:  TOPROL-XL Take 50 mg by mouth daily.   mupirocin ointment 2 % Commonly known as:  BACTROBAN Apply 1 application topically 4 (four) times daily. Apply to hands   tamsulosin 0.4 MG Caps capsule Commonly known as:  FLOMAX Take 1 capsule (0.4 mg total) by mouth daily after supper.   Vitamin D (Ergocalciferol) 50000 units Caps capsule Commonly known as:  DRISDOL Take 1 capsule by mouth every 7 (seven) days.       Allergies:  Allergies  Allergen Reactions  . Ibuprofen   .  Cephalexin Rash  . Ciprocin-Fluocin-Procin [Fluocinolone] Rash  . Erythromycin Rash  . Erythromycin Ethylsuccinate Rash  . Penicillins Rash    Has patient had a PCN reaction causing immediate rash, facial/tongue/throat swelling, SOB or lightheadedness with hypotension: no Has patient had a PCN reaction causing severe rash involving mucus membranes or skin necrosis: no Has patient had a PCN reaction that required hospitalization no Has patient had a PCN reaction occurring within the last 10 years: no If all of the above answers are "NO", then may proceed with Cephalosporin use.    . Tetracyclines & Related Rash    Family History: Family History  Problem Relation Age of Onset  . Breast  cancer Sister 1  . Kidney cancer Neg Hx   . Kidney disease Neg Hx   . Prostate cancer Neg Hx     Social History:  reports that she quit smoking about 30 years ago. she has never used smokeless tobacco. She reports that she does not drink alcohol or use drugs.  ROS: UROLOGY Frequent Urination?: No Hard to postpone urination?: No Burning/pain with urination?: No Get up at night to urinate?: No Leakage of urine?: No Urine stream starts and stops?: No Trouble starting stream?: No Do you have to strain to urinate?: No Blood in urine?: No Urinary tract infection?: No Sexually transmitted disease?: No Injury to kidneys or bladder?: No Painful intercourse?: No Weak stream?: No Currently pregnant?: No Vaginal bleeding?: No Last menstrual period?: n  Gastrointestinal Nausea?: No Vomiting?: No Indigestion/heartburn?: No Diarrhea?: No Constipation?: No  Constitutional Fever: No Night sweats?: No Weight loss?: No Fatigue?: No  Skin Skin rash/lesions?: No Itching?: No  Eyes Blurred vision?: No Double vision?: No  Ears/Nose/Throat Sore throat?: No Sinus problems?: No  Hematologic/Lymphatic Swollen glands?: No Easy bruising?: No  Cardiovascular Leg swelling?: No Chest pain?: No  Respiratory Cough?: No Shortness of breath?: No  Endocrine Excessive thirst?: No  Musculoskeletal Back pain?: No Joint pain?: No  Neurological Headaches?: No Dizziness?: No  Psychologic Depression?: No Anxiety?: Yes  Physical Exam: BP 132/74 (BP Location: Right Arm, Patient Position: Sitting, Cuff Size: Large)   Pulse 68   Ht 5' (1.524 m)   Wt 237 lb 9.6 oz (107.8 kg)   BMI 46.40 kg/m   Constitutional:  Alert and oriented, No acute distress. HEENT:  AT, moist mucus membranes.  Trachea midline, no masses. Cardiovascular: No clubbing, cyanosis, or edema. Respiratory: Normal respiratory effort, no increased work of breathing. GI: Abdomen is soft, nontender,  nondistended, no abdominal masses GU: No CVA tenderness.  Skin: No rashes, bruises or suspicious lesions. Lymph: No cervical or inguinal adenopathy. Neurologic: Grossly intact, no focal deficits, moving all 4 extremities. Psychiatric: Normal mood and affect.  Laboratory Data: Lab Results  Component Value Date   WBC 7.9 11/08/2017   HGB 13.5 11/08/2017   HCT 38.5 11/08/2017   MCV 81.5 11/08/2017   PLT 185 11/08/2017    Lab Results  Component Value Date   CREATININE 1.19 (H) 11/08/2017    No results found for: PSA  No results found for: TESTOSTERONE  No results found for: HGBA1C  Urinalysis    Component Value Date/Time   COLORURINE YELLOW (A) 11/08/2017 2308   APPEARANCEUR HAZY (A) 11/08/2017 2308   APPEARANCEUR Clear 09/13/2013 1414   LABSPEC 1.021 11/08/2017 2308   LABSPEC 1.021 09/13/2013 1414   PHURINE 5.0 11/08/2017 2308   GLUCOSEU NEGATIVE 11/08/2017 2308   GLUCOSEU Negative 09/13/2013 1414   HGBUR LARGE (A) 11/08/2017 2308  [place] NEGATIVE 11/08/2017 2308   BILIRUBINUR Negative 09/13/2013 [place] 11/08/2017 2308   PROTEINUR 30 (A) 11/08/2017 2308   NITRITE NEGATIVE 11/08/2017 2308   LEUKOCYTESUR NEGATIVE 11/08/2017 2308   LEUKOCYTESUR Negative 09/13/2013 1414    Assessment & Plan:    1. Left UVJ stone Patient has passed her stone.  At this point, she is stone free.  We will send her stone off for analysis.  I went over ways to prevent stone formation including the ABCs of stone formation handbook.  We will call her with the results of her stone analysis.  Return if symptoms worsen or fail to improve.  Nickie Retort, MD  [facility] [address] [place], [place] [address] [phone]

## 2017-12-08 ENCOUNTER — Other Ambulatory Visit: Payer: Self-pay | Admitting: Urology

## 2017-12-14 ENCOUNTER — Telehealth: Payer: Self-pay

## 2017-12-14 NOTE — Telephone Encounter (Signed)
Nickie Retort, MD  Toniann Fail C, LPN        Please let patient know her stone was mostly calcium oxalate which is what the ABCs of stone formation booklet that we went over is geared towards. She should follow the advice and that booklet. G also mail her a copy of that report for her records.    Letter sent to pt.

## 2017-12-26 DIAGNOSIS — M7989 Other specified soft tissue disorders: Secondary | ICD-10-CM | POA: Insufficient documentation

## 2017-12-26 DIAGNOSIS — L4050 Arthropathic psoriasis, unspecified: Secondary | ICD-10-CM | POA: Insufficient documentation

## 2018-04-10 ENCOUNTER — Emergency Department: Payer: BLUE CROSS/BLUE SHIELD

## 2018-04-10 ENCOUNTER — Encounter: Payer: Self-pay | Admitting: Emergency Medicine

## 2018-04-10 ENCOUNTER — Emergency Department
Admission: EM | Admit: 2018-04-10 | Discharge: 2018-04-10 | Disposition: A | Discharge status: Home / Self Care | Payer: BLUE CROSS/BLUE SHIELD | Attending: Student in an Organized Health Care Education/Training Program | Admitting: Student in an Organized Health Care Education/Training Program

## 2018-04-10 ENCOUNTER — Other Ambulatory Visit: Payer: Self-pay

## 2018-04-10 DIAGNOSIS — Z87891 Personal history of nicotine dependence: Secondary | ICD-10-CM | POA: Insufficient documentation

## 2018-04-10 DIAGNOSIS — I10 Essential (primary) hypertension: Secondary | ICD-10-CM | POA: Diagnosis not present

## 2018-04-10 DIAGNOSIS — K625 Hemorrhage of anus and rectum: Secondary | ICD-10-CM | POA: Insufficient documentation

## 2018-04-10 DIAGNOSIS — Z79899 Other long term (current) drug therapy: Secondary | ICD-10-CM | POA: Insufficient documentation

## 2018-04-10 DIAGNOSIS — R109 Unspecified abdominal pain: Secondary | ICD-10-CM | POA: Diagnosis not present

## 2018-04-10 LAB — CBC WITH DIFFERENTIAL/PLATELET
Basophils Absolute: 0 10*3/uL (ref 0–0.1)
Basophils Relative: 0 %
EOS ABS: 0.1 10*3/uL (ref 0–0.7)
EOS PCT: 2 %
HCT: 38.2 % (ref 35.0–47.0)
HEMOGLOBIN: 13.1 g/dL (ref 12.0–16.0)
LYMPHS ABS: 1.7 10*3/uL (ref 1.0–3.6)
LYMPHS PCT: 29 %
MCH: 28.2 pg (ref 26.0–34.0)
MCHC: 34.4 g/dL (ref 32.0–36.0)
MCV: 82 fL (ref 80.0–100.0)
Monocytes Absolute: 0.5 10*3/uL (ref 0.2–0.9)
Monocytes Relative: 8 %
Neutro Abs: 3.8 10*3/uL (ref 1.4–6.5)
Neutrophils Relative %: 61 %
PLATELETS: 195 10*3/uL (ref 150–440)
RBC: 4.66 MIL/uL (ref 3.80–5.20)
RDW: 14.2 % (ref 11.5–14.5)
WBC: 6.1 10*3/uL (ref 3.6–11.0)

## 2018-04-10 LAB — CBC
HEMATOCRIT: 40 % (ref 35.0–47.0)
HEMOGLOBIN: 13.8 g/dL (ref 12.0–16.0)
MCH: 28.2 pg (ref 26.0–34.0)
MCHC: 34.5 g/dL (ref 32.0–36.0)
MCV: 81.7 fL (ref 80.0–100.0)
Platelets: 222 10*3/uL (ref 150–440)
RBC: 4.89 MIL/uL (ref 3.80–5.20)
RDW: 14.1 % (ref 11.5–14.5)
WBC: 7.3 10*3/uL (ref 3.6–11.0)

## 2018-04-10 LAB — COMPREHENSIVE METABOLIC PANEL
ALT: 26 U/L (ref 14–54)
ANION GAP: 7 (ref 5–15)
AST: 31 U/L (ref 15–41)
Albumin: 4.2 g/dL (ref 3.5–5.0)
Alkaline Phosphatase: 60 U/L (ref 38–126)
BILIRUBIN TOTAL: 0.7 mg/dL (ref 0.3–1.2)
BUN: 12 mg/dL (ref 6–20)
CHLORIDE: 106 mmol/L (ref 101–111)
CO2: 24 mmol/L (ref 22–32)
Calcium: 9.3 mg/dL (ref 8.9–10.3)
Creatinine, Ser: 0.76 mg/dL (ref 0.44–1.00)
GFR calc Af Amer: >60 mL/min (ref >60)
GFR calc non Af Amer: >60 mL/min (ref >60)
Glucose, Bld: 142 mg/dL — ABNORMAL HIGH (ref 65–99)
POTASSIUM: 3.8 mmol/L (ref 3.5–5.1)
SODIUM: 137 mmol/L (ref 135–145)
TOTAL PROTEIN: 7.3 g/dL (ref 6.5–8.1)

## 2018-04-10 LAB — TYPE AND SCREEN
ABO/RH(D): A POS
ANTIBODY SCREEN: NEGATIVE

## 2018-04-10 MED ORDER — POLYETHYLENE GLYCOL 3350 17 G PO PACK: 17.0000 g | PACK | Freq: Every day | ORAL | 0 refills | Status: DC

## 2018-04-10 MED ORDER — IOPAMIDOL (ISOVUE-300) INJECTION 61%
100.0000 mL | Freq: Once | INTRAVENOUS | Status: AC | PRN
  Administered 2018-04-10: 100 mL via INTRAVENOUS
  Filled 2018-04-10: qty 100

## 2018-04-10 MED ORDER — HYDROCORTISONE ACETATE 25 MG RE SUPP: 25.0000 mg | Freq: Two times a day (BID) | RECTAL | 1 refills | Status: DC

## 2018-04-10 NOTE — ED Provider Notes (Signed)
[facility] Emergency Department Provider Note    First MD Initiated Contact with Patient 04/10/18 1622     (approximate)  I have reviewed the triage vital signs and the nursing notes.   HISTORY  Chief Complaint Rectal Bleeding    HPI Amber Ortiz is a 57 y.o. female presents with 3 episodes of bright red blood per rectum over the past 24 hours.  States she was seen by primary office today they are unable to find any hemorrhoids but she did have mild to moderate pain on digital exam.  She denies any melena.  No history of reflux.  No liver disease.  No history of diverticulosis.  She is not on any blood thinners.  Has not felt lightheaded or that she was about to faint but was concerned because she is never had these symptoms before.  Past Medical History:  Diagnosis Date  . Anxiety   . Arrhythmia   . Hypertension   . Kidney stone   . Psoriasis    Family History  Problem Relation Age of Onset  . Breast cancer Sister 57  . Kidney cancer Neg Hx   . Kidney disease Neg Hx   . Prostate cancer Neg Hx    Past Surgical History:  Procedure Laterality Date  . ABDOMINAL HYSTERECTOMY    . APPENDECTOMY    . CESAREAN SECTION    . CHOLECYSTECTOMY     Patient Active Problem List   Diagnosis Date Noted  . Essential hypertension   . Other hyperlipidemia   . Morbid obesity ([place])   . Chest pain, rule out acute myocardial infarction 11/12/2016      Prior to Admission medications   Medication Sig Start Date End Date Taking? Authorizing Provider  ammonium lactate (AMLACTIN) 12 % cream Apply 1 g topically as needed for dry skin.    [provider]  clonazePAM (KLONOPIN) 0.5 MG tablet TAKE ONE TABLET BY MOUTH AT BEDTIME 11/01/16   [provider]  escitalopram (LEXAPRO) 20 MG tablet Take 20 mg by mouth at bedtime.    [provider]  Halcinonide (HALOG) 0.1 % CREA Apply 1 application topically daily as needed.    [provider]  lisinopril (PRINIVIL,ZESTRIL) 10 MG tablet Take 1 tablet by mouth daily. 09/19/16   [provider]  metoprolol succinate (TOPROL-XL) 50 MG 24 hr tablet Take 50 mg by mouth daily. 09/23/16   [provider]  mupirocin ointment (BACTROBAN) 2 % Apply 1 application topically 4 (four) times daily. Apply to hands 10/27/16   [provider]  Secukinumab (COSENTYX 300 DOSE) 150 MG/ML SOSY Inject 300 mg into the skin every 30 (thirty) days.    [provider]  tamsulosin (FLOMAX) 0.4 MG CAPS capsule Take 1 capsule (0.4 mg total) by mouth daily after supper. 11/09/17   Arta Silence, MD  Vitamin D, Ergocalciferol, (DRISDOL) 50000 units CAPS capsule Take 1 capsule by mouth every 7 (seven) days. 09/22/16   [provider]    Allergies Ibuprofen; Cephalexin; Ciprocin-fluocin-procin [fluocinolone]; Erythromycin; Erythromycin ethylsuccinate; Penicillins; and Tetracyclines & related    Social History Social History   Tobacco Use  . Smoking status: Former Smoker    Last attempt to quit: 12/19/1986    Years since quitting: 31.3  . Smokeless tobacco: Never Used  Substance Use Topics  . Alcohol use: No    Frequency: Never  . Drug use: No    Review of Systems Patient denies headaches, rhinorrhea, blurry  vision, numbness, shortness of breath, chest pain, edema, cough, abdominal pain, nausea, vomiting, diarrhea, dysuria, fevers, rashes or hallucinations unless otherwise stated above in HPI. ____________________________________________   PHYSICAL EXAM:  VITAL SIGNS: Vitals:   04/10/18 1211  BP: (!) 143/79  Pulse: 72  Resp: 18  Temp: 97.9 F (36.6 C)  SpO2: 94%    Constitutional: Alert and oriented. Well appearing and in no acute distress. Eyes: Conjunctivae are normal.  Head: Atraumatic. Nose: No congestion/rhinnorhea. Mouth/Throat: Mucous membranes are moist.   Neck: No stridor. Painless ROM.  Cardiovascular: Normal rate,  regular rhythm. Grossly normal heart sounds.  Good peripheral circulation. Respiratory: Normal respiratory effort.  No retractions. Lungs CTAB. Gastrointestinal: Soft and nontender. No distention. No abdominal bruits. No CVA tenderness. Genitourinary: There is pain on rectal examination particularly on the right side.  Nonthrombosed non-prolapsed small external hemorrhoid.  There is some firmness in the right side and there is some streaks of frank blood but no purulence or pus Musculoskeletal: No lower extremity tenderness nor edema.  No joint effusions. Neurologic:  Normal speech and language. No gross focal neurologic deficits are appreciated. No facial droop Skin:  Skin is warm, dry and intact. No rash noted. Psychiatric: Mood and affect are normal. Speech and behavior are normal.  ____________________________________________   LABS (all labs ordered are listed, but only abnormal results are displayed)  Results for orders placed or performed during the hospital encounter of 04/10/18 (from the past 24 hour(s))  Comprehensive metabolic panel     Status: Abnormal   Collection Time: 04/10/18 12:20 PM  Result Value Ref Range   Sodium 137 135 - 145 mmol/L   Potassium 3.8 3.5 - 5.1 mmol/L   Chloride 106 101 - 111 mmol/L   CO2 24 22 - 32 mmol/L   Glucose, Bld 142 (H) 65 - 99 mg/dL   BUN 12 6 - 20 mg/dL   Creatinine, Ser 0.76 0.44 - 1.00 mg/dL   Calcium 9.3 8.9 - 10.3 mg/dL   Total Protein 7.3 6.5 - 8.1 g/dL   Albumin 4.2 3.5 - 5.0 g/dL   AST 31 15 - 41 U/L   ALT 26 14 - 54 U/L   Alkaline Phosphatase 60 38 - 126 U/L   Total Bilirubin 0.7 0.3 - 1.2 mg/dL   GFR calc non Af Amer >60 >60 mL/min   GFR calc Af Amer >60 >60 mL/min   Anion gap 7 5 - 15  CBC     Status: None   Collection Time: 04/10/18 12:20 PM  Result Value Ref Range   WBC 7.3 3.6 - 11.0 K/uL   RBC 4.89 3.80 - 5.20 MIL/uL   Hemoglobin 13.8 12.0 - 16.0 g/dL   HCT 40.0 35.0 - 47.0 %   MCV 81.7 80.0 - 100.0 fL   MCH 28.2  26.0 - 34.0 pg   MCHC 34.5 32.0 - 36.0 g/dL   RDW 14.1 11.5 - 14.5 %   Platelets 222 150 - 440 K/uL  Type and screen [place]     Status: None   Collection Time: 04/10/18 12:20 PM  Result Value Ref Range   ABO/RH(D) A POS    Antibody Screen NEG    Sample Expiration      04/13/2018 Performed at [facility], [address] [place], [place] [address]    ____________________________________________ ____________________________________________  RADIOLOGY  I personally reviewed all radiographic images ordered to evaluate for the above acute complaints and reviewed radiology reports and findings.  These findings were personally discussed with the patient.  Please see medical record for radiology report.  ____________________________________________   PROCEDURES  Procedure(s) performed:  Procedures    Critical Care performed: no ____________________________________________   INITIAL IMPRESSION / ASSESSMENT AND PLAN / ED COURSE  Pertinent labs & imaging results that were available during my care of the patient were reviewed by me and considered in my medical decision making (see chart for details).  DDX: Hemorrhoid, anal fissure, proctitis, colitis, enteritis, upper GI bleed, diverticulosis, perirectal abscess  ELIZA GREEN is a 57 y.o. who presents to the ED with symptoms as described above.  Patient well-appearing in no acute distress.  Patient with no hypotension, no elevation of BUN, normal hemoglobin.  Not on any blood thinners.  She is low risk by [place] GI bleed.  Based on rectal exam will order CT imaging to evaluate for concern of proctitis, ischemia, colitis, perirectal abscess.  Clinical Course as of Apr 10 1921  Tue Apr 10, 2018  1649 Rectal exam does show very tender left perirectal area with some fluctuance.  Will order CT abdomen pelvis to evaluate for perirectal abscess proctitis diverticular or ischemic process.    [PR]  1921 Repeat CBC essentially unchanged.  Patient without any additional bloody stools.  Patient in no acute distress.  Repeat abdominal exam soft and benign.  This point I have offered hospitalization for bowel prep and colonoscopy on Thursday or Friday the patient says she would prefer to do this as an outpatient and I think that that is perfectly reasonable based on her presentation.  Have discussed with the patient and available family all diagnostics and treatments performed thus far and all questions were answered to the best of my ability. The patient demonstrates understanding and agreement with plan.    [PR]    Clinical Course User Index [PR] Merlyn Lot, MD     As part of my medical decision making, I reviewed the following data within the [place] notes reviewed and incorporated, Labs reviewed, notes from prior ED visits and [place] Controlled Substance Database   ____________________________________________   FINAL CLINICAL IMPRESSION(S) / ED DIAGNOSES  Final diagnoses:  Rectal bleeding      NEW MEDICATIONS STARTED DURING THIS VISIT:  New Prescriptions   No medications on file     Note:  This document was prepared using Dragon voice recognition software and may include unintentional dictation errors.    Merlyn Lot, MD 04/10/18 1925

## 2018-04-10 NOTE — ED Notes (Signed)
IV removed, started by previous shift

## 2018-04-10 NOTE — ED Triage Notes (Signed)
Pt here for 3 episodes bright red blood from rectum. Denies with stool, just had pressure and blood come out.  6 weeks ago did start humira per pt.  Ambulatory. No pain. "I feel ok".  Color WNL at this time.

## 2018-04-10 NOTE — ED Notes (Signed)
Pt c/o pain with bright red blood from the rectum for the past couple of days. Denies any abd pain N/V/D.Marland Kitchen No bleeding with stools.Marland Kitchen

## 2018-04-11 ENCOUNTER — Encounter: Payer: Self-pay | Admitting: Gastroenterology

## 2018-04-11 ENCOUNTER — Other Ambulatory Visit: Payer: Self-pay

## 2018-04-11 ENCOUNTER — Ambulatory Visit: Payer: BLUE CROSS/BLUE SHIELD | Admitting: Gastroenterology

## 2018-04-11 VITALS — BP 126/63 | HR 65 | Ht 60.0 in | Wt 232.0 lb

## 2018-04-11 DIAGNOSIS — K625 Hemorrhage of anus and rectum: Secondary | ICD-10-CM

## 2018-04-11 NOTE — Progress Notes (Signed)
[name], MD [address]  [place]  [place], [place] [address]  Main: [phone]  Fax: [phone]    Gastroenterology Consultation  Referring Provider:     [name], MD Primary Care Physician:  [name], MD Primary Gastroenterologist:  Dr. [name] Reason for Consultation:     Rectal bleeding        HPI:   Amber Ortiz is a 57 y.o. female referred by Dr. Ellison Hughs, Chrissie Noa, MD  for consultation & management of acute episode of rectal bleeding. She has history of psoriasis for which she is taking Humira started 6 weeks ago. Previously she was on cosentyx for 2 years. She felt like having a BM day before yesterday but when she went that came out and finally she felt pressure in her rectum around 11 PM and had a BM with bright red bleeding which was self-limited. She had 2 more episodes around 4 AM and 8 AM associated with blood on wiping and blood in toilet, without a BM. She went to ER yesterday because of ongoing rectal bleeding, she is also experiencing abdominal cramps. Her hemoglobin in the ER was unremarkable, CMP was normal. She underwent CT abdomen and pelvis with contrast which did not reveal any acute intra-abdominal pathology except for fatty liver. She was discharged home on Anusol suppository. She denies nausea, vomiting, fever, chills, diarrhea. She denies constipation. 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 She denies smoking or alcohol use  NSAIDs: none  Antiplts/Anticoagulants/Anti thrombotics: none  GI Procedures: reports having had a colonoscopy about 5 years ago by [facility] clinic GI. She was told that they had to use pediatric colonoscope because of the scar tissue and she had significant pain postprocedure. She denies family history of colon cancer  Past Medical History:  Diagnosis Date  . Anxiety   . Arrhythmia   . Hypertension   . Kidney stone   . Psoriasis     Past Surgical History:    Procedure Laterality Date  . ABDOMINAL HYSTERECTOMY    . APPENDECTOMY    . CESAREAN SECTION    . CHOLECYSTECTOMY       Current Outpatient Medications:  .  Adalimumab (HUMIRA PEN) 40 MG/0.8ML PNKT, 80 mg SQ first dose then 40 mg every other week beginning 1 week after first dose., Disp: , Rfl:  .  ammonium lactate (AMLACTIN) 12 % cream, Apply 1 g topically as needed for dry skin., Disp: , Rfl:  .  clonazePAM (KLONOPIN) 0.5 MG tablet, TAKE ONE TABLET BY MOUTH AT BEDTIME, Disp: , Rfl:  .  escitalopram (LEXAPRO) 20 MG tablet, Take 20 mg by mouth at bedtime., Disp: , Rfl:  .  HUMIRA PEN-PS/UV/ADOL HS START 40 MG/0.8ML PNKT, , Disp: , Rfl:  .  hydrocortisone (ANUSOL-HC) 25 MG suppository, Place 1 suppository (25 mg total) rectally every 12 (twelve) hours., Disp: 12 suppository, Rfl: 1 .  lisinopril (PRINIVIL,ZESTRIL) 10 MG tablet, Take 1 tablet by mouth daily., Disp: , Rfl: 5 .  metoprolol succinate (TOPROL-XL) 50 MG 24 hr tablet, Take 50 mg by mouth daily., Disp: , Rfl: 4 .  mupirocin ointment (BACTROBAN) 2 %, Apply 1 application topically 4 (four) times daily. Apply to hands, Disp: , Rfl: 0 .  polyethylene glycol (MIRALAX / GLYCOLAX) packet, Take 17 g by mouth daily. Mix one tablespoon with 8oz of your favorite juice or water every day until you are having soft formed stools. Then start taking  once daily if you didn't have a stool the day before., Disp: 30 each, Rfl: 0 .  Vitamin D, Ergocalciferol, (DRISDOL) 50000 units CAPS capsule, Take 1 capsule by mouth every 7 (seven) days., Disp: , Rfl: 5 .  clobetasol ointment (TEMOVATE) 0.05 %, APPLY TO AFFECTED AREA TWICE A DAY, Disp: , Rfl: 2 .  Halcinonide (HALOG) 0.1 % CREA, Apply 1 application topically daily as needed., Disp: , Rfl:    Family History  Problem Relation Age of Onset  . Breast cancer Sister 10  . Kidney cancer Neg Hx   . Kidney disease Neg Hx   . Prostate cancer Neg Hx      Social History   Tobacco Use  . Smoking status:  Former Smoker    Last attempt to quit: 12/19/1986    Years since quitting: 31.3  . Smokeless tobacco: Never Used  Substance Use Topics  . Alcohol use: No    Frequency: Never  . Drug use: No    Allergies as of 04/11/2018 - Review Complete 04/11/2018  Allergen Reaction Noted  . Ibuprofen  11/08/2017  . Cephalexin Rash   . Ciprocin-fluocin-procin [fluocinolone] Rash 11/12/2016  . Ciprofloxacin Rash 04/11/2018  . Clarithromycin Rash 02/12/2015  . Doxycycline calcium Rash 04/11/2018  . Erythromycin Rash 11/12/2016  . Erythromycin ethylsuccinate Rash   . Penicillins Rash 11/12/2016  . Tetracyclines & related Rash 11/12/2016    Review of Systems:    All systems reviewed and negative except where noted in HPI.   Physical Exam:  BP 126/63   Pulse 65   Ht 5' (1.524 m)   Wt 232 lb (105.2 kg)   BMI 45.31 kg/m  No LMP recorded. Patient has had a hysterectomy.  General:   Alert,  Well-developed, well-nourished, pleasant and cooperative in NAD Head:  Normocephalic and atraumatic. Eyes:  Sclera clear, no icterus.   Conjunctiva pink. Ears:  Normal auditory acuity. Nose:  No deformity, discharge, or lesions. Mouth:  No deformity or lesions,oropharynx pink & moist. Neck:  Supple; no masses or thyromegaly. Lungs:  Respirations even and unlabored.  Clear throughout to auscultation.   No wheezes, crackles, or rhonchi. No acute distress. Heart:  Regular rate and rhythm; no murmurs, clicks, rubs, or gallops. Abdomen:  Normal bowel sounds. Soft, obese, non-tender and non-distended without masses, hepatosplenomegaly or hernias noted.  No guarding or rebound tenderness.   Rectal: Not performed, perianal exam revealed mild perianal rash, no external hemorrhoids, nontender Msk:  Symmetrical without gross deformities. Good, equal movement & strength bilaterally. Pulses:  Normal pulses noted. Extremities:  No clubbing or edema.  No cyanosis. Neurologic:  Alert and oriented x3;  grossly normal  neurologically. Skin:  Intact without significant lesions or rashes. No jaundice. Psych:  Alert and cooperative. Normal mood and affect.  Imaging Studies: reviewed  Assessment and Plan:   Amber Ortiz is a 57 y.o. Caucasian female with obesity presents with 2 days' history of painless rectal bleeding and mild abdominal cramps, hemodynamically insignificant lower GI bleed. Hemoglobin has been normal and at baseline. This is probably an outlet bleeding from internal hemorrhoids. However, given her age I recommend colonoscopy to rule out malignancy  - She will continue Anusol suppository 2 times a day - Clear liquid diet today - Colonoscopy tomorrow - Bowel prep instructions were given - I also discussed with her about outpatient hemorrhoid ligation after the colonoscopy  Follow up in 2 weeks   [name], MD

## 2018-04-12 ENCOUNTER — Encounter
Admission: RE | Disposition: A | Discharge status: Home / Self Care | Payer: Self-pay | Source: Ambulatory Visit | Attending: Gastroenterology

## 2018-04-12 ENCOUNTER — Encounter: Payer: Self-pay | Admitting: *Deleted

## 2018-04-12 ENCOUNTER — Ambulatory Visit: Payer: BLUE CROSS/BLUE SHIELD | Admitting: Anesthesiology

## 2018-04-12 ENCOUNTER — Ambulatory Visit
Admission: RE | Admit: 2018-04-12 | Discharge: 2018-04-12 | Disposition: A | Discharge status: Home / Self Care | Payer: BLUE CROSS/BLUE SHIELD | Source: Ambulatory Visit | Attending: Gastroenterology | Admitting: Gastroenterology

## 2018-04-12 DIAGNOSIS — Z91010 Allergy to peanuts: Secondary | ICD-10-CM | POA: Diagnosis not present

## 2018-04-12 DIAGNOSIS — Z87891 Personal history of nicotine dependence: Secondary | ICD-10-CM | POA: Diagnosis not present

## 2018-04-12 DIAGNOSIS — Z803 Family history of malignant neoplasm of breast: Secondary | ICD-10-CM | POA: Insufficient documentation

## 2018-04-12 DIAGNOSIS — R7303 Prediabetes: Secondary | ICD-10-CM | POA: Diagnosis not present

## 2018-04-12 DIAGNOSIS — I10 Essential (primary) hypertension: Secondary | ICD-10-CM | POA: Diagnosis not present

## 2018-04-12 DIAGNOSIS — K625 Hemorrhage of anus and rectum: Secondary | ICD-10-CM | POA: Insufficient documentation

## 2018-04-12 DIAGNOSIS — Z90710 Acquired absence of both cervix and uterus: Secondary | ICD-10-CM | POA: Insufficient documentation

## 2018-04-12 DIAGNOSIS — F418 Other specified anxiety disorders: Secondary | ICD-10-CM | POA: Diagnosis not present

## 2018-04-12 DIAGNOSIS — I499 Cardiac arrhythmia, unspecified: Secondary | ICD-10-CM | POA: Diagnosis not present

## 2018-04-12 DIAGNOSIS — Z79899 Other long term (current) drug therapy: Secondary | ICD-10-CM | POA: Diagnosis not present

## 2018-04-12 DIAGNOSIS — Z9049 Acquired absence of other specified parts of digestive tract: Secondary | ICD-10-CM | POA: Diagnosis not present

## 2018-04-12 DIAGNOSIS — D123 Benign neoplasm of transverse colon: Secondary | ICD-10-CM | POA: Insufficient documentation

## 2018-04-12 DIAGNOSIS — F419 Anxiety disorder, unspecified: Secondary | ICD-10-CM | POA: Diagnosis not present

## 2018-04-12 DIAGNOSIS — Z881 Allergy status to other antibiotic agents status: Secondary | ICD-10-CM | POA: Insufficient documentation

## 2018-04-12 DIAGNOSIS — K648 Other hemorrhoids: Secondary | ICD-10-CM | POA: Insufficient documentation

## 2018-04-12 DIAGNOSIS — Z888 Allergy status to other drugs, medicaments and biological substances status: Secondary | ICD-10-CM | POA: Diagnosis not present

## 2018-04-12 HISTORY — PX: COLONOSCOPY WITH PROPOFOL: SHX5780

## 2018-04-12 SURGERY — COLONOSCOPY WITH PROPOFOL
Anesthesia: General

## 2018-04-12 MED ORDER — PROPOFOL 10 MG/ML IV BOLUS
INTRAVENOUS | Status: DC | PRN
  Administered 2018-04-12: 60 mg via INTRAVENOUS

## 2018-04-12 MED ORDER — PROPOFOL 500 MG/50ML IV EMUL
INTRAVENOUS | Status: AC
  Filled 2018-04-12: qty 50

## 2018-04-12 MED ORDER — SODIUM CHLORIDE 0.9 % IV SOLN
INTRAVENOUS | Status: DC
  Administered 2018-04-12: 1000 mL via INTRAVENOUS
  Administered 2018-04-12: 10:00:00 via INTRAVENOUS

## 2018-04-12 MED ORDER — LIDOCAINE HCL (PF) 1 % IJ SOLN
2.0000 mL | Freq: Once | INTRAMUSCULAR | Status: AC
  Administered 2018-04-12: 0.3 mL via INTRADERMAL

## 2018-04-12 MED ORDER — LIDOCAINE HCL (PF) 1 % IJ SOLN
INTRAMUSCULAR | Status: AC
  Administered 2018-04-12: 0.3 mL via INTRADERMAL
  Filled 2018-04-12: qty 2

## 2018-04-12 MED ORDER — LIDOCAINE HCL (PF) 2 % IJ SOLN
INTRAMUSCULAR | Status: AC
  Filled 2018-04-12: qty 10

## 2018-04-12 MED ORDER — ONDANSETRON HCL 4 MG/2ML IJ SOLN
4.0000 mg | Freq: Once | INTRAMUSCULAR | Status: AC
  Administered 2018-04-12: 4 mg via INTRAVENOUS

## 2018-04-12 MED ORDER — MIDAZOLAM HCL 2 MG/2ML IJ SOLN: INTRAMUSCULAR | Status: DC | PRN

## 2018-04-12 MED ORDER — MIDAZOLAM HCL 2 MG/2ML IJ SOLN
INTRAMUSCULAR | Status: AC
  Filled 2018-04-12: qty 2

## 2018-04-12 MED ORDER — PROPOFOL 500 MG/50ML IV EMUL
INTRAVENOUS | Status: DC | PRN
  Administered 2018-04-12: 180 ug/kg/min via INTRAVENOUS

## 2018-04-12 MED ORDER — ONDANSETRON HCL 4 MG/2ML IJ SOLN
INTRAMUSCULAR | Status: AC
  Filled 2018-04-12: qty 2

## 2018-04-12 NOTE — Anesthesia Post-op Follow-up Note (Signed)
Anesthesia QCDR form completed.        

## 2018-04-12 NOTE — Anesthesia Preprocedure Evaluation (Signed)
Anesthesia Evaluation  Patient identified by MRN, date of birth, ID band Patient awake    Reviewed: Allergy & Precautions, NPO status , Patient's Chart, lab work & pertinent test results  History of Anesthesia Complications Negative for: history of anesthetic complications  Airway Mallampati: III       Dental   Pulmonary neg sleep apnea, neg COPD, former smoker,           Cardiovascular hypertension, Pt. on medications and Pt. on home beta blockers (-) Past MI and (-) CHF (-) dysrhythmias (-) Valvular Problems/Murmurs     Neuro/Psych neg Seizures Anxiety Depression    GI/Hepatic Neg liver ROS, neg GERD  ,  Endo/Other  diabetes (borderline)  Renal/GU Renal InsufficiencyRenal disease     Musculoskeletal   Abdominal   Peds  Hematology   Anesthesia Other Findings   Reproductive/Obstetrics                             Anesthesia Physical Anesthesia Plan  ASA: II  Anesthesia Plan: General   Post-op Pain Management:    Induction:   PONV Risk Score and Plan: 3 and TIVA, Propofol infusion and Treatment may vary due to age or medical condition  Airway Management Planned: Nasal Cannula  Additional Equipment:   Intra-op Plan:   Post-operative Plan:   Informed Consent: I have reviewed the patients History and Physical, chart, labs and discussed the procedure including the risks, benefits and alternatives for the proposed anesthesia with the patient or authorized representative who has indicated his/her understanding and acceptance.     Plan Discussed with:   Anesthesia Plan Comments:         Anesthesia Quick Evaluation

## 2018-04-12 NOTE — Anesthesia Postprocedure Evaluation (Signed)
Anesthesia Post Note  Patient: Amber Ortiz  Procedure(s) Performed: COLONOSCOPY WITH PROPOFOL (N/A )  Patient location during evaluation: Endoscopy Anesthesia Type: General Level of consciousness: awake and alert Pain management: pain level controlled Vital Signs Assessment: post-procedure vital signs reviewed and stable Respiratory status: spontaneous breathing and respiratory function stable Cardiovascular status: stable Anesthetic complications: no     Last Vitals:  Vitals:   04/12/18 0928  BP: (!) 152/71  Pulse: 81  Resp: 17  Temp: (!) 35.6 C  SpO2: 96%    Last Pain:  Vitals:   04/12/18 0928  TempSrc: Tympanic  PainSc: 0-No pain                 Rupal Childress K

## 2018-04-12 NOTE — Transfer of Care (Signed)
Immediate Anesthesia Transfer of Care Note  Patient: Amber Ortiz  Procedure(s) Performed: COLONOSCOPY WITH PROPOFOL (N/A )  Patient Location: PACU  Anesthesia Type:General  Level of Consciousness: awake  Airway & Oxygen Therapy: Patient Spontanous Breathing and Patient connected to nasal cannula oxygen  Post-op Assessment: Report given to RN and Post -op Vital signs reviewed and stable  Post vital signs: Reviewed and stable  Last Vitals:  Vitals Value Taken Time  BP 108/48 04/12/2018 10:30 AM  Temp    Pulse 82 04/12/2018 10:32 AM  Resp 13 04/12/2018 10:32 AM  SpO2 98 % 04/12/2018 10:32 AM  Vitals shown include unvalidated device data.  Last Pain:  Vitals:   04/12/18 0928  TempSrc: Tympanic  PainSc: 0-No pain         Complications: No apparent anesthesia complications

## 2018-04-12 NOTE — Anesthesia Procedure Notes (Signed)
Date/Time: 04/12/2018 10:15 AM Performed by: Allean Found, CRNA Pre-anesthesia Checklist: Patient identified, Emergency Drugs available, Suction available, Patient being monitored and Timeout performed Oxygen Delivery Method: Nasal cannula Induction Type: IV induction Placement Confirmation: positive ETCO2

## 2018-04-12 NOTE — H&P (Signed)
[name], MD [address]  [place]  [place], [place] [address]  Main: [phone]  Fax: [phone] Pager: [phone]  Primary Care Physician:  [name], MD Primary Gastroenterologist:  Dr. [name]  Pre-Procedure History & Physical: HPI:  Amber Ortiz is a 57 y.o. female is here for an colonoscopy.   Past Medical History:  Diagnosis Date  . Anxiety   . Arrhythmia   . Hypertension   . Kidney stone   . Psoriasis     Past Surgical History:  Procedure Laterality Date  . ABDOMINAL HYSTERECTOMY    . APPENDECTOMY    . CESAREAN SECTION    . CHOLECYSTECTOMY      Prior to Admission medications   Medication Sig Start Date End Date Taking? Authorizing Provider  Adalimumab (HUMIRA PEN) 40 MG/0.8ML PNKT 80 mg SQ first dose then 40 mg every other week beginning 1 week after first dose. 02/13/18   [provider]  ammonium lactate (AMLACTIN) 12 % cream Apply 1 g topically as needed for dry skin.    [provider]  clobetasol ointment (TEMOVATE) 0.05 % APPLY TO AFFECTED AREA TWICE A DAY 03/29/18   [provider]  clonazePAM (KLONOPIN) 0.5 MG tablet TAKE ONE TABLET BY MOUTH AT BEDTIME 11/01/16   [provider]  escitalopram (LEXAPRO) 20 MG tablet Take 20 mg by mouth at bedtime.    [provider]  Halcinonide (HALOG) 0.1 % CREA Apply 1 application topically daily as needed.    [provider]  HUMIRA PEN-PS/UV/ADOL HS START 40 MG/0.8ML PNKT  02/13/18   [provider]  hydrocortisone (ANUSOL-HC) 25 MG suppository Place 1 suppository (25 mg total) rectally every 12 (twelve) hours. 04/10/18 04/10/19  Merlyn Lot, MD  lisinopril (PRINIVIL,ZESTRIL) 10 MG tablet Take 1 tablet by mouth daily. 09/19/16   [provider]  metoprolol succinate (TOPROL-XL) 50 MG 24 hr tablet Take 50 mg by mouth daily. 09/23/16   [provider]  mupirocin ointment (BACTROBAN) 2 % Apply 1  application topically 4 (four) times daily. Apply to hands 10/27/16   [provider]  polyethylene glycol (MIRALAX / GLYCOLAX) packet Take 17 g by mouth daily. Mix one tablespoon with 8oz of your favorite juice or water every day until you are having soft formed stools. Then start taking once daily if you didn't have a stool the day before. 04/10/18   Merlyn Lot, MD  Vitamin D, Ergocalciferol, (DRISDOL) 50000 units CAPS capsule Take 1 capsule by mouth every 7 (seven) days. 09/22/16   [provider]    Allergies as of 04/11/2018 - Review Complete 04/11/2018  Allergen Reaction Noted  . Ibuprofen  11/08/2017  . Cephalexin Rash   . Ciprocin-fluocin-procin [fluocinolone] Rash 11/12/2016  . Ciprofloxacin Rash 04/11/2018  . Clarithromycin Rash 02/12/2015  . Doxycycline calcium Rash 04/11/2018  . Erythromycin Rash 11/12/2016  . Erythromycin ethylsuccinate Rash   . Penicillins Rash 11/12/2016  . Tetracyclines & related Rash 11/12/2016    Family History  Problem Relation Age of Onset  . Breast cancer Sister 10  . Kidney cancer Neg Hx   . Kidney disease Neg Hx   . Prostate cancer Neg Hx     Social History   Socioeconomic History  . Marital status: Married    Spouse name: Not on file  . Number of children: Not on file  . Years of education: Not on file  . Highest education level: Not on file  Occupational  History  . Not on file  Social Needs  . Financial resource strain: Not on file  . Food insecurity:    Worry: Not on file    Inability: Not on file  . Transportation needs:    Medical: Not on file    Non-medical: Not on file  Tobacco Use  . Smoking status: Former Smoker    Last attempt to quit: 12/19/1986    Years since quitting: 31.3  . Smokeless tobacco: Never Used  Substance and Sexual Activity  . Alcohol use: No    Frequency: Never  . Drug use: No  . Sexual activity: Not on file  Lifestyle  . Physical activity:    Days per week: Not on file     Minutes per session: Not on file  . Stress: Not on file  Relationships  . Social connections:    Talks on phone: Not on file    Gets together: Not on file    Attends religious service: Not on file    Active member of club or organization: Not on file    Attends meetings of clubs or organizations: Not on file    Relationship status: Not on file  . Intimate partner violence:    Fear of current or ex partner: Not on file    Emotionally abused: Not on file    Physically abused: Not on file    Forced sexual activity: Not on file  Other Topics Concern  . Not on file  Social History Narrative  . Not on file    Review of Systems: See HPI, otherwise negative ROS  Physical Exam: BP (!) 152/71   Pulse 81   Temp (!) 96.1 F (35.6 C) (Tympanic)   Resp 17   Ht 5' (1.524 m)   Wt 232 lb (105.2 kg)   SpO2 96%   BMI 45.31 kg/m  General:   Alert,  pleasant and cooperative in NAD Head:  Normocephalic and atraumatic. Neck:  Supple; no masses or thyromegaly. Lungs:  Clear throughout to auscultation.    Heart:  Regular rate and rhythm. Abdomen:  Soft, nontender and nondistended. Normal bowel sounds, without guarding, and without rebound.   Neurologic:  Alert and  oriented x4;  grossly normal neurologically.  Impression/Plan: Amber Ortiz is here for an colonoscopy to be performed for rectal bleeding  Risks, benefits, limitations, and alternatives regarding  colonoscopy have been reviewed with the patient.  Questions have been answered.  All parties agreeable.   Sherri Sear, MD  04/12/2018, 9:55 AM

## 2018-04-12 NOTE — Op Note (Signed)
[facility]enter[facility] Patient Name: Amber Ortiz Procedure Date: 04/12/2018 10:02 AM MRN: [id number] Account #: [id number] Date of Birth: May 10, 1961 Admit Type: Outpatient Age: 57 Room: [facility] ENDO ROOM 2 Gender: Female Note Status: Finalized Procedure:            Colonoscopy Indications:          Last colonoscopy: December 2013, Rectal bleeding Providers:            Lin Landsman MD, MD Referring MD:         [name] (Referring MD) Medicines:            Monitored Anesthesia Care Complications:        No immediate complications. Estimated blood loss: None. Procedure:            Pre-Anesthesia Assessment:                       - Prior to the procedure, a History and Physical was                        performed, and patient medications and allergies were                        reviewed. The patient is competent. The risks and                        benefits of the procedure and the sedation options and                        risks were discussed with the patient. All questions                        were answered and informed consent was obtained.                        Patient identification and proposed procedure were                        verified by the physician, the nurse, the                        anesthesiologist, the anesthetist and the technician in                        the pre-procedure area in the procedure room in the                        endoscopy suite. Mental Status Examination: alert and                        oriented. Airway Examination: normal oropharyngeal                        airway and neck mobility. Respiratory Examination:                        clear to auscultation. CV Examination: normal.                        Prophylactic Antibiotics: The patient does not require  prophylactic antibiotics. Prior Anticoagulants: The                        patient has taken no previous anticoagulant or                         antiplatelet agents. ASA Grade Assessment: II - A                        patient with mild systemic disease. After reviewing the                        risks and benefits, the patient was deemed in                        satisfactory condition to undergo the procedure. The                        anesthesia plan was to use monitored anesthesia care                        (MAC). Immediately prior to administration of                        medications, the patient was re-assessed for adequacy                        to receive sedatives. The heart rate, respiratory rate,                        oxygen saturations, blood pressure, adequacy of                        pulmonary ventilation, and response to care were                        monitored throughout the procedure. The physical status                        of the patient was re-assessed after the procedure.                       After obtaining informed consent, the colonoscope was                        passed under direct vision. Throughout the procedure,                        the patient's blood pressure, pulse, and oxygen                        saturations were monitored continuously. The                        Colonoscope was introduced through the anus and                        advanced to the the cecum, identified by appendiceal  orifice and ileocecal valve. The colonoscopy was                        performed without difficulty. The patient tolerated the                        procedure well. The quality of the bowel preparation                        was evaluated using the BBPS [facility] Bowel Preparation                        Scale) with scores of: Right Colon = 3, Transverse                        Colon = 3 and Left Colon = 3 (entire mucosa seen well                        with no residual staining, small fragments of stool or                        opaque liquid). The total BBPS  score equals 9. Findings:      The perianal and digital rectal examinations were normal. Pertinent       negatives include normal sphincter tone and no palpable rectal lesions.      A diminutive polyp was found in the transverse colon. The polyp was       sessile. The polyp was removed with a cold biopsy forceps. Resection and       retrieval were complete.      Non-bleeding internal hemorrhoids were found during retroflexion. The       hemorrhoids were medium-sized.      Narrow sigmoid colon      The colon (entire examined portion) appeared normal. Impression:           - One diminutive polyp in the transverse colon, removed                        with a cold biopsy forceps. Resected and retrieved.                       - Non-bleeding internal hemorrhoids.                       - The entire examined colon is normal. Recommendation:       - Discharge patient to home.                       - Resume previous diet today.                       - Continue present medications.                       - Await pathology results.                       - Repeat colonoscopy in 5-10 years for surveillance                        based on pathology  results.                       - Return to my office tomorrow for hemorrhoid ligation. Procedure Code(s):    --- Professional ---                       [phone], Colonoscopy, flexible; with biopsy, single or                        multiple Diagnosis Code(s):    --- Professional ---                       D12.3, Benign neoplasm of transverse colon (hepatic                        flexure or splenic flexure)                       K64.8, Other hemorrhoids                       K62.5, Hemorrhage of anus and rectum CPT copyright 2017 American Medical Association. All rights reserved. The codes documented in this report are preliminary and upon coder review may  be revised to meet current compliance requirements. Dr. Ulyess Mort Lin Landsman MD,  MD 04/12/2018 10:26:16 AM This report has been signed electronically. Number of Addenda: 0 Note Initiated On: 04/12/2018 10:02 AM Scope Withdrawal Time: 0 hours 7 minutes 1 second  Total Procedure Duration: 0 hours 13 minutes 34 seconds       [facility]

## 2018-04-13 ENCOUNTER — Other Ambulatory Visit: Payer: Self-pay

## 2018-04-13 ENCOUNTER — Ambulatory Visit: Payer: BLUE CROSS/BLUE SHIELD | Admitting: Gastroenterology

## 2018-04-13 ENCOUNTER — Encounter: Payer: Self-pay | Admitting: Gastroenterology

## 2018-04-13 VITALS — BP 117/70 | HR 67 | Temp 98.0°F | Ht 60.0 in | Wt 230.2 lb

## 2018-04-13 DIAGNOSIS — K640 First degree hemorrhoids: Secondary | ICD-10-CM | POA: Diagnosis not present

## 2018-04-13 LAB — SURGICAL PATHOLOGY

## 2018-04-13 NOTE — Progress Notes (Signed)
PROCEDURE NOTE: The patient presents with symptomatic grade 1 hemorrhoids, unresponsive to maximal medical therapy, requesting rubber band ligation of his/her hemorrhoidal disease.  All risks, benefits and alternative forms of therapy were described and informed consent was obtained.  In the Left Lateral Decubitus position (if anoscopy is performed) anoscopic examination revealed grade 1 hemorrhoids in the all position(s).   The decision was made to band the RA internal hemorrhoid, and the [place] was used to perform band ligation without complication.  Digital anorectal examination was then performed to assure proper positioning of the band, and to adjust the banded tissue as required.  The patient was discharged home without pain or other issues.  Dietary and behavioral recommendations were given and (if necessary - prescriptions were given), along with follow-up instructions.  The patient will return 2 weeks for follow-up and possible additional banding as required.  No complications were encountered and the patient tolerated the procedure well.  [name], MD [address]  [place]  [place], [place] [address]  Main: [phone]  Fax: [phone] Pager: [phone]

## 2018-04-16 ENCOUNTER — Encounter: Payer: Self-pay | Admitting: Gastroenterology

## 2018-04-26 ENCOUNTER — Ambulatory Visit: Payer: BLUE CROSS/BLUE SHIELD | Admitting: Gastroenterology

## 2018-04-30 ENCOUNTER — Encounter: Payer: Self-pay | Admitting: Gastroenterology

## 2018-04-30 ENCOUNTER — Ambulatory Visit: Payer: BLUE CROSS/BLUE SHIELD | Admitting: Gastroenterology

## 2018-04-30 VITALS — BP 150/71 | HR 60 | Resp 17 | Ht 65.0 in | Wt 229.8 lb

## 2018-04-30 DIAGNOSIS — K640 First degree hemorrhoids: Secondary | ICD-10-CM | POA: Diagnosis not present

## 2018-04-30 DIAGNOSIS — K625 Hemorrhage of anus and rectum: Secondary | ICD-10-CM

## 2018-04-30 NOTE — Progress Notes (Signed)
PROCEDURE NOTE: The patient presents with symptomatic grade 1 hemorrhoids, unresponsive to maximal medical therapy, requesting rubber band ligation of his/her hemorrhoidal disease.  All risks, benefits and alternative forms of therapy were described and informed consent was obtained.  Tenderness in posterior wall of anal canal on rectal exam  The decision was made to band the LL internal hemorrhoid, and the [place] was used to perform band ligation without complication.  Digital anorectal examination was then performed to assure proper positioning of the band, and to adjust the banded tissue as required.  The patient was discharged home without pain or other issues.  Dietary and behavioral recommendations were given and (if necessary - prescriptions were given), along with follow-up instructions.  The patient will return 2 weeks for follow-up and possible additional banding as required.  No complications were encountered and the patient tolerated the procedure well.  Recommend to apply topical nitro 1-2 times daily for rectal tenderness  [name], MD [address]  [place]  [place], [place] [address]  Main: [phone]  Fax: [phone] Pager: [phone]

## 2018-05-17 ENCOUNTER — Ambulatory Visit (INDEPENDENT_AMBULATORY_CARE_PROVIDER_SITE_OTHER): Payer: BLUE CROSS/BLUE SHIELD | Admitting: Gastroenterology

## 2018-05-17 ENCOUNTER — Encounter: Payer: Self-pay | Admitting: Gastroenterology

## 2018-05-17 VITALS — BP 132/81 | HR 87 | Resp 17 | Ht 65.0 in | Wt 229.6 lb

## 2018-05-17 DIAGNOSIS — K640 First degree hemorrhoids: Secondary | ICD-10-CM | POA: Diagnosis not present

## 2018-05-17 NOTE — Progress Notes (Signed)
PROCEDURE NOTE: The patient presents with symptomatic grade 1 hemorrhoids, unresponsive to maximal medical therapy, requesting rubber band ligation of his/her hemorrhoidal disease.  All risks, benefits and alternative forms of therapy were described and informed consent was obtained.   The decision was made to band the RP internal hemorrhoid, and the [place] was used to perform band ligation without complication.  Digital anorectal examination was then performed to assure proper positioning of the band, and to adjust the banded tissue as required.  The patient was discharged home without pain or other issues.  Dietary and behavioral recommendations were given and (if necessary - prescriptions were given), along with follow-up instructions.  The patient will return  as needed for follow-up and possible additional banding as required.  No complications were encountered and the patient tolerated the procedure well.  [name], MD [address]  [place]  [place], [place] [address]  Main: [phone]  Fax: [phone] Pager: [phone]

## 2018-08-21 ENCOUNTER — Ambulatory Visit
Admission: EM | Admit: 2018-08-21 | Discharge: 2018-08-21 | Disposition: A | Discharge status: Home / Self Care | Payer: BLUE CROSS/BLUE SHIELD | Attending: Family Medicine | Admitting: Family Medicine

## 2018-08-21 ENCOUNTER — Other Ambulatory Visit: Payer: Self-pay

## 2018-08-21 ENCOUNTER — Encounter: Payer: Self-pay | Admitting: Emergency Medicine

## 2018-08-21 DIAGNOSIS — M79675 Pain in left toe(s): Secondary | ICD-10-CM

## 2018-08-21 DIAGNOSIS — B9689 Other specified bacterial agents as the cause of diseases classified elsewhere: Secondary | ICD-10-CM | POA: Diagnosis not present

## 2018-08-21 DIAGNOSIS — L409 Psoriasis, unspecified: Secondary | ICD-10-CM | POA: Diagnosis not present

## 2018-08-21 DIAGNOSIS — L089 Local infection of the skin and subcutaneous tissue, unspecified: Secondary | ICD-10-CM

## 2018-08-21 MED ORDER — MUPIROCIN 2 % EX OINT: 1.0000 "application " | TOPICAL_OINTMENT | Freq: Three times a day (TID) | CUTANEOUS | 0 refills | Status: DC

## 2018-08-21 NOTE — ED Provider Notes (Signed)
[facility]    CSN: [id number] Arrival date & time: 08/21/18  0932     History   Chief Complaint Chief Complaint  Patient presents with  . Toe Pain    left 1st toe    HPI Amber Ortiz is a 57 y.o. female.   HPI  57 year old female presents with left first toe pain with redness and peeling of skin.  She has had psoriasis of her hands and feet.  The feet are much worse on the soles of her feet and her great toe.  Is noticed over the last couple days a peeling of the dry skin with redness underneath with purplish type color change but no significant drainage.  Is on Humira for her psoriasis; she states has helped his psoriasis immensely but the issues with the toe have just occurred.  She is unable to get into see her primary or her dermatologist today.  Most of the issues with the medial aspect of her great toe is also developed a fissure over the lateral base of her great toe.          Past Medical History:  Diagnosis Date  . Anxiety   . Arrhythmia   . Hypertension   . Kidney stone   . Psoriasis     Patient Active Problem List   Diagnosis Date Noted  . Rectal bleeding   . Psoriatic arthritis ([place]) 12/26/2017  . Swelling of finger of right hand 12/26/2017  . Psoriasis, unspecified 01/13/2017  . Essential hypertension   . Other hyperlipidemia   . Morbid obesity ([place])   . Chest pain, rule out acute myocardial infarction 11/12/2016  . Anxiety 07/16/2014  . Depression 07/16/2014  . Diabetes mellitus type 2, uncomplicated ([place]) 08/01/4817  . Vitamin D deficiency, unspecified 07/16/2014    Past Surgical History:  Procedure Laterality Date  . ABDOMINAL HYSTERECTOMY    . APPENDECTOMY    . CESAREAN SECTION    . CHOLECYSTECTOMY    . COLONOSCOPY WITH PROPOFOL N/A 04/12/2018   Procedure: COLONOSCOPY WITH PROPOFOL;  Surgeon: Lin Landsman, MD;  Location: [facility] ENDOSCOPY;  Service: Gastroenterology;  Laterality: N/A;    OB History   None       Home Medications    Prior to Admission medications   Medication Sig Start Date End Date Taking? Authorizing Provider  Adalimumab (HUMIRA PEN) 40 MG/0.8ML PNKT 80 mg SQ first dose then 40 mg every other week beginning 1 week after first dose. 02/13/18  Yes [provider]  clobetasol ointment (TEMOVATE) 0.05 % APPLY TO AFFECTED AREA TWICE A DAY 03/29/18  Yes [provider]  escitalopram (LEXAPRO) 20 MG tablet Take 20 mg by mouth at bedtime.   Yes [provider]  Halcinonide (HALOG) 0.1 % CREA Apply 1 application topically daily as needed.   Yes [provider]  lisinopril (PRINIVIL,ZESTRIL) 10 MG tablet Take 1 tablet by mouth daily. 09/19/16  Yes [provider]  metoprolol succinate (TOPROL-XL) 50 MG 24 hr tablet Take 50 mg by mouth daily. 09/23/16  Yes [provider]  Vitamin D, Ergocalciferol, (DRISDOL) 50000 units CAPS capsule Take 1 capsule by mouth every 7 (seven) days. 09/22/16  Yes [provider]  ammonium lactate (AMLACTIN) 12 % cream Apply 1 g topically as needed for dry skin.    [provider]  clonazePAM (KLONOPIN) 0.5 MG tablet TAKE ONE TABLET BY MOUTH AT BEDTIME 11/01/16   [provider]  HUMIRA PEN 40 MG/0.4ML PNKT  04/02/18   [provider]  HUMIRA PEN-PS/UV/ADOL HS START 40 MG/0.8ML PNKT  02/13/18   [provider]  mupirocin ointment (BACTROBAN) 2 % Apply 1 application topically 3 (three) times daily. 08/21/18   Lorin Picket, PA-C  polyethylene glycol (MIRALAX / GLYCOLAX) packet Take 17 g by mouth daily. Mix one tablespoon with 8oz of your favorite juice or water every day until you are having soft formed stools. Then start taking once daily if you didn't have a stool the day before. 04/10/18   Merlyn Lot, MD    Family History Family History  Problem Relation Age of Onset  . Breast cancer Sister 46  . Kidney cancer Neg Hx   . Kidney disease Neg Hx   . Prostate  cancer Neg Hx     Social History Social History   Tobacco Use  . Smoking status: Former Smoker    Last attempt to quit: 12/19/1986    Years since quitting: 31.6  . Smokeless tobacco: Never Used  Substance Use Topics  . Alcohol use: No    Frequency: Never  . Drug use: No     Allergies   Ibuprofen; Cephalexin; Ciprocin-fluocin-procin [fluocinolone]; Ciprofloxacin; Clarithromycin; Doxycycline calcium; Erythromycin; Erythromycin ethylsuccinate; Penicillins; and Tetracyclines & related   Review of Systems Review of Systems  Constitutional: Positive for activity change. Negative for appetite change, chills, fatigue and fever.  Skin: Positive for color change and rash.  All other systems reviewed and are negative.    Physical Exam Triage Vital Signs ED Triage Vitals  Enc Vitals Group     BP 08/21/18 0956 (!) 146/73     Pulse Rate 08/21/18 0956 82     Resp 08/21/18 0956 16     Temp 08/21/18 0956 98.4 F (36.9 C)     Temp Source 08/21/18 0956 Oral     SpO2 08/21/18 0956 97 %     Weight 08/21/18 0952 226 lb (102.5 kg)     Height 08/21/18 0952 5' (1.524 m)     Head Circumference --      Peak Flow --      Pain Score 08/21/18 0952 4     Pain Loc --      Pain Edu? --      Excl. in [place]? --    No data found.  Updated Vital Signs BP (!) 146/73 (BP Location: Left Arm)   Pulse 82   Temp 98.4 F (36.9 C) (Oral)   Resp 16   Ht 5' (1.524 m)   Wt 226 lb (102.5 kg)   SpO2 97%   BMI 44.14 kg/m   Visual Acuity Right Eye Distance:   Left Eye Distance:   Bilateral Distance:    Right Eye Near:   Left Eye Near:    Bilateral Near:     Physical Exam  Constitutional: She is oriented to person, place, and time. She appears well-developed and well-nourished. No distress.  HENT:  Head: Normocephalic.  Eyes: Pupils are equal, round, and reactive to light. Right eye exhibits no discharge. Left eye exhibits no discharge.  Neck: Normal range of motion.  Musculoskeletal: Normal  range of motion.  Neurological: She is alert and oriented to person, place, and time.  Skin: Skin is warm and dry. She is not diaphoretic. There is erythema.  Exam of the left foot is psoriasis mostly of the soles of the feet as well as the toe.  On the great toe over the medial aspect is  scaling  with loosening and producing the undersurface of redness.  There is no drainage present.  The toe does blanch.  On the lateral aspect at the base of the great toe is a small fissure that is open but not draining.  Area is very tender to the touch.  There is some warmth.  Psychiatric: She has a normal mood and affect. Her behavior is normal. Judgment and thought content normal.  Nursing note and vitals reviewed.    UC Treatments / Results  Labs (all labs ordered are listed, but only abnormal results are displayed) Labs Reviewed - No data to display  EKG None  Radiology No results found.  Procedures Procedures (including critical care time)  Medications Ordered in UC Medications - No data to display  Initial Impression / Assessment and Plan / UC Course  I have reviewed the triage vital signs and the nursing notes.  Pertinent labs & imaging results that were available during my care of the patient were reviewed by me and considered in my medical decision making (see chart for details).     Plan: 1. Test/x-ray results and diagnosis reviewed with patient 2. rx as per orders; risks, benefits, potential side effects reviewed with patient 3. Recommend supportive treatment with avoiding pressure on the area as much as possible.  She currently is wearing socks and flip flops.  Prevent further infection I will prescribe mupirocin ointment  that she will apply 3 times daily after washing and drying thoroughly with a hair dryer.  She is allergic to almost all oral antibiotics and therefore we will have to rely on cleansing and the use of topicals.  I have asked her to follow-up with her dermatologist  as soon as possible.  At this time besides pressure relief she should wear only cotton socks.  She runs any fevers or has significant drainage from the area or worsening she should go to the emergency room 4. F/u prn if symptoms worsen or don't improve  Final Clinical Impressions(s) / UC Diagnoses   Final diagnoses:  Psoriasis  Superficial bacterial skin infection     Discharge Instructions     Wash area 3 times daily and dry thoroughly using a hair dryer on low setting.  Apply mupirocin ointment 3 times daily.  May cover with a dry dressing.  Use only cotton socks to cover the area   ED Prescriptions    Medication Sig Dispense Auth. Provider   mupirocin ointment (BACTROBAN) 2 % Apply 1 application topically 3 (three) times daily. 22 g Lorin Picket, PA-C     Controlled Substance Prescriptions  Controlled Substance Registry consulted? Not Applicable   Lorin Picket, PA-C 08/21/18 1214

## 2018-08-21 NOTE — Discharge Instructions (Signed)
Wash area 3 times daily and dry thoroughly using a hair dryer on low setting.  Apply mupirocin ointment 3 times daily.  May cover with a dry dressing.  Use only cotton socks to cover the area

## 2018-08-21 NOTE — ED Triage Notes (Signed)
Patient c/o left 1st toe pain with redness and peeling skin that 3 years ago.  Patient states that it has gotten worse over the past couple of days.

## 2018-08-30 DIAGNOSIS — K625 Hemorrhage of anus and rectum: Secondary | ICD-10-CM | POA: Insufficient documentation

## 2018-08-30 HISTORY — DX: Hemorrhage of anus and rectum: K62.5

## 2018-10-22 ENCOUNTER — Other Ambulatory Visit: Payer: Self-pay

## 2018-10-22 ENCOUNTER — Ambulatory Visit
Admission: EM | Admit: 2018-10-22 | Discharge: 2018-10-22 | Disposition: A | Discharge status: Home / Self Care | Payer: BLUE CROSS/BLUE SHIELD | Attending: Family Medicine | Admitting: Family Medicine

## 2018-10-22 ENCOUNTER — Ambulatory Visit (INDEPENDENT_AMBULATORY_CARE_PROVIDER_SITE_OTHER): Payer: BLUE CROSS/BLUE SHIELD

## 2018-10-22 DIAGNOSIS — J069 Acute upper respiratory infection, unspecified: Secondary | ICD-10-CM | POA: Diagnosis not present

## 2018-10-22 DIAGNOSIS — B9789 Other viral agents as the cause of diseases classified elsewhere: Secondary | ICD-10-CM

## 2018-10-22 MED ORDER — CETIRIZINE-PSEUDOEPHEDRINE ER 5-120 MG PO TB12: 1.0000 | ORAL_TABLET | Freq: Two times a day (BID) | ORAL | 0 refills | Status: DC

## 2018-10-22 MED ORDER — HYDROCOD POLST-CPM POLST ER 10-8 MG/5ML PO SUER: 5.0000 mL | Freq: Two times a day (BID) | ORAL | 0 refills | Status: DC | PRN

## 2018-10-22 NOTE — ED Provider Notes (Signed)
[facility]    CSN: [id number] Arrival date & time: 10/22/18  1319  History   Chief Complaint Chief Complaint  Patient presents with  . Cough   HPI  57 year old female presents with cough, congestion, sinus pain and pressure.  Patient states she is been sick for the past week.  She is been experiencing cough, congestion, sinus pain and pressure.  Her predominant complaint is a cough which is worsening.  She has taken Mucinex and Robitussin without relief.  Cough is severe.  Associated urinary incontinence with a cough.  No fever.  She has had a slight elevated temperature of 100.  No other reported symptoms.  No other complaints.  PMH, Surgical Hx, Family Hx, Social History reviewed and updated as below.  Past Medical History:  Diagnosis Date  . Anxiety   . Arrhythmia   . Hypertension   . Kidney stone   . Psoriasis    Patient Active Problem List   Diagnosis Date Noted  . Rectal bleeding   . Psoriatic arthritis ([place]) 12/26/2017  . Swelling of finger of right hand 12/26/2017  . Psoriasis, unspecified 01/13/2017  . Essential hypertension   . Other hyperlipidemia   . Morbid obesity ([place])   . Chest pain, rule out acute myocardial infarction 11/12/2016  . Anxiety 07/16/2014  . Depression 07/16/2014  . Diabetes mellitus type 2, uncomplicated ([place]) 11/91/4782  . Vitamin D deficiency, unspecified 07/16/2014   Past Surgical History:  Procedure Laterality Date  . ABDOMINAL HYSTERECTOMY    . APPENDECTOMY    . CESAREAN SECTION    . CHOLECYSTECTOMY    . COLONOSCOPY WITH PROPOFOL N/A 04/12/2018   Procedure: COLONOSCOPY WITH PROPOFOL;  Surgeon: Lin Landsman, MD;  Location: [facility] ENDOSCOPY;  Service: Gastroenterology;  Laterality: N/A;    OB History   None    Home Medications    Prior to Admission medications   Medication Sig Start Date End Date Taking? Authorizing Provider  Adalimumab (HUMIRA PEN) 40 MG/0.8ML PNKT 80 mg SQ first dose then 40 mg every  other week beginning 1 week after first dose. 02/13/18  Yes [provider]  ammonium lactate (AMLACTIN) 12 % cream Apply 1 g topically as needed for dry skin.   Yes [provider]  clobetasol ointment (TEMOVATE) 0.05 % APPLY TO AFFECTED AREA TWICE A DAY 03/29/18  Yes [provider]  clonazePAM (KLONOPIN) 0.5 MG tablet TAKE ONE TABLET BY MOUTH AT BEDTIME 11/01/16  Yes [provider]  escitalopram (LEXAPRO) 20 MG tablet Take 20 mg by mouth at bedtime.   Yes [provider]  HUMIRA PEN 40 MG/0.4ML PNKT  04/02/18  Yes [provider]  HUMIRA PEN-PS/UV/ADOL HS START 40 MG/0.8ML PNKT  02/13/18  Yes [provider]  lisinopril (PRINIVIL,ZESTRIL) 10 MG tablet Take 1 tablet by mouth daily. 09/19/16  Yes [provider]  metoprolol succinate (TOPROL-XL) 50 MG 24 hr tablet Take 50 mg by mouth daily. 09/23/16  Yes [provider]  mupirocin ointment (BACTROBAN) 2 % Apply 1 application topically 3 (three) times daily. 08/21/18  Yes Lorin Picket, PA-C  Vitamin D, Ergocalciferol, (DRISDOL) 50000 units CAPS capsule Take 1 capsule by mouth every 7 (seven) days. 09/22/16  Yes [provider]  cetirizine-pseudoephedrine (ZYRTEC-D) 5-120 MG tablet Take 1 tablet by mouth 2 (two) times daily. 10/22/18   Coral Spikes, DO  chlorpheniramine-HYDROcodone (TUSSIONEX PENNKINETIC ER) 10-8 MG/5ML SUER Take 5 mLs by mouth every 12 (twelve) hours as needed. 10/22/18  Tausha Milhoan G, DO  Halcinonide (HALOG) 0.1 % CREA Apply 1 application topically daily as needed.    [provider]   Family History Family History  Problem Relation Age of Onset  . Breast cancer Sister 10  . Kidney cancer Neg Hx   . Kidney disease Neg Hx   . Prostate cancer Neg Hx    Social History Social History   Tobacco Use  . Smoking status: Former Smoker    Last attempt to quit: 12/19/1986    Years since quitting: 31.8  . Smokeless tobacco: Never Used    Substance Use Topics  . Alcohol use: No    Frequency: Never  . Drug use: No   Allergies   Ibuprofen; Cephalexin; Ciprocin-fluocin-procin [fluocinolone]; Ciprofloxacin; Clarithromycin; Doxycycline calcium; Erythromycin; Erythromycin ethylsuccinate; Penicillins; and Tetracyclines & related   Review of Systems Review of Systems  HENT: Positive for congestion, sinus pressure and sinus pain.   Respiratory: Positive for cough.    Physical Exam Triage Vital Signs ED Triage Vitals  Enc Vitals Group     BP 10/22/18 1353 (!) 144/65     Pulse Rate 10/22/18 1353 83     Resp 10/22/18 1353 18     Temp 10/22/18 1353 98.5 F (36.9 C)     Temp Source 10/22/18 1353 Oral     SpO2 10/22/18 1353 98 %     Weight 10/22/18 1352 223 lb (101.2 kg)     Height 10/22/18 1352 5' (1.524 m)     Head Circumference --      Peak Flow --      Pain Score 10/22/18 1351 4     Pain Loc --      Pain Edu? --      Excl. in [place]? --    Updated Vital Signs BP (!) 144/65 (BP Location: Left Arm)   Pulse 83   Temp 98.5 F (36.9 C) (Oral)   Resp 18   Ht 5' (1.524 m)   Wt 101.2 kg   SpO2 98%   BMI 43.55 kg/m   Visual Acuity Right Eye Distance:   Left Eye Distance:   Bilateral Distance:    Right Eye Near:   Left Eye Near:    Bilateral Near:     Physical Exam  Constitutional: She is oriented to person, place, and time. She appears well-developed. No distress.  HENT:  Head: Normocephalic and atraumatic.  Mouth/Throat: Oropharynx is clear and moist.  Eyes: Conjunctivae are normal. Right eye exhibits no discharge. Left eye exhibits no discharge.  Cardiovascular: Normal rate and regular rhythm.  Pulmonary/Chest: Effort normal. No respiratory distress.  Scattered wheezing.  Neurological: She is alert and oriented to person, place, and time.  Psychiatric: She has a normal mood and affect. Her behavior is normal.  Nursing note and vitals reviewed.  UC Treatments / Results  Labs (all labs ordered are  listed, but only abnormal results are displayed) Labs Reviewed - No data to display  EKG None  Radiology Dg Chest 2 View  Result Date: 10/22/2018 CLINICAL DATA:  Cough. EXAM: CHEST - 2 VIEW COMPARISON:  Radiographs of November 12, 2016. FINDINGS: The heart size and mediastinal contours are within normal limits. Both lungs are clear. The visualized skeletal structures are unremarkable. IMPRESSION: No active cardiopulmonary disease. Electronically Signed   By: Marijo Conception, M.D.   On: 10/22/2018 14:27    Procedures Procedures (including critical care time)  Medications Ordered in UC Medications - No data to display  Initial Impression / Assessment and Plan / UC Course  I have reviewed the triage vital signs and the nursing notes.  Pertinent labs & imaging results that were available during my care of the patient were reviewed by me and considered in my medical decision making (see chart for details).    57 year old female presents with viral URI with cough.  Given her immunosuppression and complaints, chest x-ray was obtained and was negative.  Treating with Zyrtec-D.  Tussionex for cough.  Final Clinical Impressions(s) / UC Diagnoses   Final diagnoses:  Viral URI with cough     Discharge Instructions     Xray clear.  Medications as prescribed.  Take care  Dr. Lacinda Axon    ED Prescriptions    Medication Sig Dispense Auth. Provider   cetirizine-pseudoephedrine (ZYRTEC-D) 5-120 MG tablet Take 1 tablet by mouth 2 (two) times daily. 30 tablet [place], [place] G, DO   chlorpheniramine-HYDROcodone (TUSSIONEX PENNKINETIC ER) 10-8 MG/5ML SUER Take 5 mLs by mouth every 12 (twelve) hours as needed. 115 mL Coral Spikes, DO     Controlled Substance Prescriptions [place] Controlled Substance Registry consulted? Not Applicable   Coral Spikes, DO 10/22/18 1522

## 2018-10-22 NOTE — Discharge Instructions (Signed)
Xray clear.  Medications as prescribed.  Take care  Dr. Lacinda Axon

## 2018-10-22 NOTE — ED Triage Notes (Signed)
Patient complains of cough, congestion, runny nose, sinus pain and pressure. Patient states that this started 1 week ago. Patient reports that she has been coughing very forcibly and has been wetting on herself. States that she is on Humira, concerned that she could have pneumonia.

## 2019-01-11 ENCOUNTER — Other Ambulatory Visit: Payer: Self-pay

## 2019-01-11 ENCOUNTER — Ambulatory Visit: Payer: BLUE CROSS/BLUE SHIELD | Admitting: Gastroenterology

## 2019-01-11 ENCOUNTER — Encounter: Payer: Self-pay | Admitting: Gastroenterology

## 2019-01-11 VITALS — BP 146/83 | HR 102 | Resp 18 | Ht 65.0 in | Wt 227.6 lb

## 2019-01-11 DIAGNOSIS — K625 Hemorrhage of anus and rectum: Secondary | ICD-10-CM | POA: Diagnosis not present

## 2019-01-11 DIAGNOSIS — K640 First degree hemorrhoids: Secondary | ICD-10-CM | POA: Diagnosis not present

## 2019-01-11 NOTE — Progress Notes (Signed)
[name], MD [address]  [place]  [place], [place] [address]  Main: [phone]  Fax: [phone]    Gastroenterology Consultation  Referring Provider:     [name], MD Primary Care Physician:  [name], MD Primary Gastroenterologist:  Dr. [name] Reason for Consultation:     Rectal bleeding        HPI:   Amber Ortiz is a 58 y.o. female referred by Dr. Ellison Hughs, Chrissie Noa, MD  for consultation & management of acute episode of rectal bleeding. She has history of psoriasis for which she is taking Humira started 6 weeks ago. Previously she was on cosentyx for 2 years. She felt like having a BM day before yesterday but when she went that came out and finally she felt pressure in her rectum around 11 PM and had a BM with bright red bleeding which was self-limited. She had 2 more episodes around 4 AM and 8 AM associated with blood on wiping and blood in toilet, without a BM. She went to ER yesterday because of ongoing rectal bleeding, she is also experiencing abdominal cramps. Her hemoglobin in the ER was unremarkable, CMP was normal. She underwent CT abdomen and pelvis with contrast which did not reveal any acute intra-abdominal pathology except for fatty liver. She was discharged home on Anusol suppository. She denies nausea, vomiting, fever, chills, diarrhea. She denies constipation. 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 She denies smoking or alcohol use  Follow-up visit 01/11/2019 Patient had recurrence of painless rectal bleeding which lasted for 2 weeks after Christmas.  She noticed bright red blood per rectum in the toilet bowl as well as on wiping.  She denies any clots.  She denies any other symptoms.  She has regular bowel movements.  Her psoriasis is fairly under control on Humira.  She is here to discuss about repeat hemorrhoid ligation.  She is no longer having rectal bleeding.  NSAIDs:  none  Antiplts/Anticoagulants/Anti thrombotics: none  GI Procedures: reports having had a colonoscopy about 5 years ago by [facility] GI. She was told that they had to use pediatric colonoscope because of the scar tissue and she had significant pain postprocedure. She denies family history of colon cancer  Colonoscopy 04/12/2018 - One diminutive polyp in the transverse colon, removed with a cold biopsy forceps. Resected and retrieved. - Non-bleeding internal hemorrhoids. - The entire examined colon is normal.  DIAGNOSIS:  A. COLON POLYP, TRANSVERSE; COLD BIOPSY:  - TUBULAR ADENOMA.  - NEGATIVE FOR HIGH-GRADE DYSPLASIA AND MALIGNANCY.   Past Medical History:  Diagnosis Date  . Anxiety   . Arrhythmia   . Hypertension   . Kidney stone   . Psoriasis     Past Surgical History:  Procedure Laterality Date  . ABDOMINAL HYSTERECTOMY    . APPENDECTOMY    . CESAREAN SECTION    . CHOLECYSTECTOMY    . COLONOSCOPY WITH PROPOFOL N/A 04/12/2018   Procedure: COLONOSCOPY WITH PROPOFOL;  Surgeon: Lin Landsman, MD;  Location: [facility] ENDOSCOPY;  Service: Gastroenterology;  Laterality: N/A;     Current Outpatient Medications:  .  Adalimumab (HUMIRA PEN) 40 MG/0.8ML PNKT, 80 mg SQ first dose then 40 mg every other week beginning 1 week after first dose., Disp: , Rfl:  .  ammonium lactate (AMLACTIN) 12 % cream, Apply 1 g topically as needed for dry skin., Disp: , Rfl:  .  chlorpheniramine-HYDROcodone (TUSSIONEX PENNKINETIC ER) 10-8 MG/5ML  SUER, Take 5 mLs by mouth every 12 (twelve) hours as needed., Disp: 115 mL, Rfl: 0 .  clobetasol ointment (TEMOVATE) 0.05 %, APPLY TO AFFECTED AREA TWICE A DAY, Disp: , Rfl: 2 .  clonazePAM (KLONOPIN) 0.5 MG tablet, TAKE ONE TABLET BY MOUTH AT BEDTIME, Disp: , Rfl:  .  escitalopram (LEXAPRO) 20 MG tablet, Take 20 mg by mouth at bedtime., Disp: , Rfl:  .  HUMIRA PEN 40 MG/0.4ML PNKT, , Disp: , Rfl:  .  Hypodermic Needles-Disposable (MONOJECT MEDICATION  TRANSF NDL) MISC, Med Name: for hemorrhoids, Disp: , Rfl:  .  lisinopril (PRINIVIL,ZESTRIL) 10 MG tablet, Take 1 tablet by mouth daily., Disp: , Rfl: 5 .  metoprolol succinate (TOPROL-XL) 50 MG 24 hr tablet, Take 50 mg by mouth daily., Disp: , Rfl: 4 .  polyethylene glycol (MIRALAX / GLYCOLAX) packet, Take by mouth., Disp: , Rfl:  .  urea (CARMOL) 40 % CREA, Apply to thick scales at night time, Disp: , Rfl:  .  Vitamin D, Ergocalciferol, (DRISDOL) 50000 units CAPS capsule, Take 1 capsule by mouth every 7 (seven) days., Disp: , Rfl: 5 .  cetirizine-pseudoephedrine (ZYRTEC-D) 5-120 MG tablet, Take 1 tablet by mouth 2 (two) times daily. (Patient not taking: Reported on 01/11/2019), Disp: 30 tablet, Rfl: 0 .  Halcinonide (HALOG) 0.1 % CREA, Apply 1 application topically daily as needed., Disp: , Rfl:  .  HUMIRA PEN-PS/UV/ADOL HS START 40 MG/0.8ML PNKT, , Disp: , Rfl:  .  mupirocin ointment (BACTROBAN) 2 %, Apply 1 application topically 3 (three) times daily. (Patient not taking: Reported on 01/11/2019), Disp: 22 g, Rfl: 0   Family History  Problem Relation Age of Onset  . Breast cancer Sister 71  . Kidney cancer Neg Hx   . Kidney disease Neg Hx   . Prostate cancer Neg Hx      Social History   Tobacco Use  . Smoking status: Former Smoker    Last attempt to quit: 12/19/1986    Years since quitting: 32.0  . Smokeless tobacco: Never Used  Substance Use Topics  . Alcohol use: No    Frequency: Never  . Drug use: No    Allergies as of 01/11/2019 - Review Complete 01/11/2019  Allergen Reaction Noted  . Ibuprofen  11/08/2017  . Cephalexin Rash   . Ciprocin-fluocin-procin [fluocinolone] Rash 11/12/2016  . Ciprofloxacin Rash 04/11/2018  . Clarithromycin Rash 02/12/2015  . Doxycycline calcium Rash 04/11/2018  . Erythromycin Rash 11/12/2016  . Erythromycin ethylsuccinate Rash   . Penicillins Rash 11/12/2016  . Rosuvastatin Rash 07/18/2018  . Tetracyclines & related Rash 11/12/2016     Review of Systems:    All systems reviewed and negative except where noted in HPI.   Physical Exam:  BP (!) 146/83 (BP Location: Left Arm, Patient Position: Sitting, Cuff Size: Large)   Pulse (!) 102   Resp 18   Ht 5\' 5"  (1.651 m)   Wt 227 lb 9.6 oz (103.2 kg)   BMI 37.87 kg/m  No LMP recorded. Patient has had a hysterectomy.  General:   Alert,  Well-developed, well-nourished, pleasant and cooperative in NAD Head:  Normocephalic and atraumatic. Eyes:  Sclera clear, no icterus.   Conjunctiva pink. Ears:  Normal auditory acuity. Nose:  No deformity, discharge, or lesions. Mouth:  No deformity or lesions,oropharynx pink & moist. Neck:  Supple; no masses or thyromegaly. Lungs:  Respirations even and unlabored.  Clear throughout to auscultation.   No wheezes, crackles, or rhonchi. No acute  distress. Heart:  Regular rate and rhythm; no murmurs, clicks, rubs, or gallops. Abdomen:  Normal bowel sounds. Soft, obese, non-tender and non-distended without masses, hepatosplenomegaly or hernias noted.  No guarding or rebound tenderness.   Rectal: Not performed, perianal exam revealed mild perianal rash, no external hemorrhoids, nontender Msk:  Symmetrical without gross deformities. Good, equal movement & strength bilaterally. Pulses:  Normal pulses noted. Extremities:  No clubbing or edema.  No cyanosis. Neurologic:  Alert and oriented x3;  grossly normal neurologically. Skin:  Intact without significant lesions or rashes. No jaundice. Psych:  Alert and cooperative. Normal mood and affect.  Imaging Studies: reviewed  Assessment and Plan:   Amber Ortiz is a 58 y.o. Caucasian female with obesity seen in consultation for painless blood per rectum secondary to internal hemorrhoids.  Patient underwent ligation of right anterior, right posterior and left lateral hemorrhoids in 04/2018 with no recurrence of rectal bleeding until 11/2018 which lasted for 2 weeks. Colonoscopy was  unremarkable except for internal hemorrhoids.  She denies any other hemorrhoidal symptoms.  Due to recurrence of rectal bleeding that lasted for 2 weeks, I discussed with her about repeat hemorrhoid ligation and patient is willing to undergo.  Consent obtained.  Perform hemorrhoid ligation today  Follow up in 2 weeks   [name], MD

## 2019-01-11 NOTE — Progress Notes (Signed)
PROCEDURE NOTE: The patient presents with symptomatic grade 1 hemorrhoids, unresponsive to maximal medical therapy, requesting rubber band ligation of his/her hemorrhoidal disease.  All risks, benefits and alternative forms of therapy were described and informed consent was obtained.  The decision was made to band the RP internal hemorrhoid, and the [place] was used to perform band ligation without complication.  Digital anorectal examination was then performed to assure proper positioning of the band, and to adjust the banded tissue as required.  The patient was discharged home without pain or other issues.  Dietary and behavioral recommendations were given and (if necessary - prescriptions were given), along with follow-up instructions.  The patient will return 2 weeks for follow-up and possible additional banding as required.  No complications were encountered and the patient tolerated the procedure well.  [name], MD [address]  [place]  [place], [place] [address]  Main: [phone]  Fax: [phone] Pager: [phone]

## 2019-01-21 ENCOUNTER — Encounter: Payer: Self-pay | Admitting: Gastroenterology

## 2019-01-29 ENCOUNTER — Other Ambulatory Visit: Payer: Self-pay

## 2019-01-29 ENCOUNTER — Ambulatory Visit: Payer: BLUE CROSS/BLUE SHIELD | Admitting: Gastroenterology

## 2019-01-29 ENCOUNTER — Encounter: Payer: Self-pay | Admitting: Gastroenterology

## 2019-01-29 VITALS — BP 116/72 | HR 69 | Resp 17 | Ht 65.0 in | Wt 226.8 lb

## 2019-01-29 DIAGNOSIS — K640 First degree hemorrhoids: Secondary | ICD-10-CM

## 2019-01-29 NOTE — Progress Notes (Signed)
[name], MD [address]  [place]  [place], [place] [address]  Main: [phone]  Fax: [phone]    Gastroenterology Consultation  Referring Provider:     [name], MD Primary Care Physician:  [name], MD Primary Gastroenterologist:  Dr. [name] Reason for Consultation:     Rectal bleeding, internal hemorrhoids        HPI:   Amber Ortiz is a 58 y.o. female referred by Dr. Ellison Hughs, Chrissie Noa, MD  for consultation & management of acute episode of rectal bleeding. She has history of psoriasis for which she is taking Humira started 6 weeks ago. Previously she was on cosentyx for 2 years. She felt like having a BM day before yesterday but when she went that came out and finally she felt pressure in her rectum around 11 PM and had a BM with bright red bleeding which was self-limited. She had 2 more episodes around 4 AM and 8 AM associated with blood on wiping and blood in toilet, without a BM. She went to ER yesterday because of ongoing rectal bleeding, she is also experiencing abdominal cramps. Her hemoglobin in the ER was unremarkable, CMP was normal. She underwent CT abdomen and pelvis with contrast which did not reveal any acute intra-abdominal pathology except for fatty liver. She was discharged home on Anusol suppository. She denies nausea, vomiting, fever, chills, diarrhea. She denies constipation. 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 She denies smoking or alcohol use  Follow-up visit 01/11/2019 Patient had recurrence of painless rectal bleeding which lasted for 2 weeks after Christmas.  She noticed bright red blood per rectum in the toilet bowl as well as on wiping.  She denies any clots.  She denies any other symptoms.  She has regular bowel movements.  Her psoriasis is fairly under control on Humira.  She is here to discuss about repeat hemorrhoid ligation.  She is no longer having rectal bleeding.  Follow-up  visit 01/29/2019 She denies any more rectal bleeding.  She would like to defer hemorrhoid ligation today She denies any other GI symptoms  NSAIDs: none  Antiplts/Anticoagulants/Anti thrombotics: none  GI Procedures: reports having had a colonoscopy about 5 years ago by [facility] clinic GI. She was told that they had to use pediatric colonoscope because of the scar tissue and she had significant pain postprocedure. She denies family history of colon cancer  Colonoscopy 04/12/2018 - One diminutive polyp in the transverse colon, removed with a cold biopsy forceps. Resected and retrieved. - Non-bleeding internal hemorrhoids. - The entire examined colon is normal.  DIAGNOSIS:  A. COLON POLYP, TRANSVERSE; COLD BIOPSY:  - TUBULAR ADENOMA.  - NEGATIVE FOR HIGH-GRADE DYSPLASIA AND MALIGNANCY.   Past Medical History:  Diagnosis Date  . Anxiety   . Arrhythmia   . Hypertension   . Kidney stone   . Psoriasis     Past Surgical History:  Procedure Laterality Date  . ABDOMINAL HYSTERECTOMY    . APPENDECTOMY    . CESAREAN SECTION    . CHOLECYSTECTOMY    . COLONOSCOPY WITH PROPOFOL N/A 04/12/2018   Procedure: COLONOSCOPY WITH PROPOFOL;  Surgeon: Lin Landsman, MD;  Location: [facility] ENDOSCOPY;  Service: Gastroenterology;  Laterality: N/A;     Current Outpatient Medications:  .  Adalimumab (HUMIRA PEN) 40 MG/0.8ML PNKT, 80 mg SQ first dose then 40 mg every other week beginning 1 week after first dose., Disp: , Rfl:  .  ammonium lactate (AMLACTIN) 12 % cream, Apply 1 g topically as needed for dry skin., Disp: , Rfl:  .  clobetasol ointment (TEMOVATE) 0.05 %, APPLY TO AFFECTED AREA TWICE A DAY, Disp: , Rfl: 2 .  clonazePAM (KLONOPIN) 0.5 MG tablet, TAKE ONE TABLET BY MOUTH AT BEDTIME, Disp: , Rfl:  .  escitalopram (LEXAPRO) 20 MG tablet, Take 20 mg by mouth at bedtime., Disp: , Rfl:  .  HUMIRA PEN 40 MG/0.4ML PNKT, , Disp: , Rfl:  .  Hypodermic Needles-Disposable (MONOJECT MEDICATION  TRANSF NDL) MISC, Med Name: for hemorrhoids, Disp: , Rfl:  .  lisinopril (PRINIVIL,ZESTRIL) 10 MG tablet, Take 1 tablet by mouth daily., Disp: , Rfl: 5 .  metoprolol succinate (TOPROL-XL) 50 MG 24 hr tablet, Take 50 mg by mouth daily., Disp: , Rfl: 4 .  polyethylene glycol (MIRALAX / GLYCOLAX) packet, Take by mouth., Disp: , Rfl:  .  PROAIR RESPICLICK 989 (90 Base) MCG/ACT AEPB, INHALE 2 INHALATIONS INTO THE LUNGS EVERY 4 (FOUR) HOURS AS NEEDED FOR WHEEZING, Disp: , Rfl:  .  urea (CARMOL) 40 % CREA, Apply to thick scales at night time, Disp: , Rfl:  .  Vitamin D, Ergocalciferol, (DRISDOL) 50000 units CAPS capsule, Take 1 capsule by mouth every 7 (seven) days., Disp: , Rfl: 5 .  cetirizine-pseudoephedrine (ZYRTEC-D) 5-120 MG tablet, Take 1 tablet by mouth 2 (two) times daily. (Patient not taking: Reported on 01/11/2019), Disp: 30 tablet, Rfl: 0 .  chlorpheniramine-HYDROcodone (TUSSIONEX PENNKINETIC ER) 10-8 MG/5ML SUER, Take 5 mLs by mouth every 12 (twelve) hours as needed. (Patient not taking: Reported on 01/29/2019), Disp: 115 mL, Rfl: 0 .  Halcinonide (HALOG) 0.1 % CREA, Apply 1 application topically daily as needed., Disp: , Rfl:  .  HUMIRA PEN-PS/UV/ADOL HS START 40 MG/0.8ML PNKT, , Disp: , Rfl:  .  mupirocin ointment (BACTROBAN) 2 %, Apply 1 application topically 3 (three) times daily. (Patient not taking: Reported on 01/11/2019), Disp: 22 g, Rfl: 0 .  predniSONE (STERAPRED UNI-PAK 21 TAB) 10 MG (21) TBPK tablet, TAKE 1 TABLET (10 MG TOTAL) BY MOUTH AS DIRECTED 6 DAY TAPER, 6, 5, 4, 3, 2, 1, Disp: , Rfl:    Family History  Problem Relation Age of Onset  . Breast cancer Sister 88  . Kidney cancer Neg Hx   . Kidney disease Neg Hx   . Prostate cancer Neg Hx      Social History   Tobacco Use  . Smoking status: Former Smoker    Last attempt to quit: 12/19/1986    Years since quitting: 32.1  . Smokeless tobacco: Never Used  Substance Use Topics  . Alcohol use: No    Frequency: Never  .  Drug use: No    Allergies as of 01/29/2019 - Review Complete 01/29/2019  Allergen Reaction Noted  . Ibuprofen  11/08/2017  . Cephalexin Rash   . Ciprocin-fluocin-procin [fluocinolone] Rash 11/12/2016  . Ciprofloxacin Rash 04/11/2018  . Clarithromycin Rash 02/12/2015  . Doxycycline calcium Rash 04/11/2018  . Erythromycin Rash 11/12/2016  . Erythromycin ethylsuccinate Rash   . Penicillins Rash 11/12/2016  . Rosuvastatin Rash 07/18/2018  . Tetracyclines & related Rash 11/12/2016    Review of Systems:    All systems reviewed and negative except where noted in HPI.   Physical Exam:  BP 116/72 (BP Location: Left Arm, Patient Position: Sitting, Cuff Size: Large)   Pulse 69   Resp 17   Ht 5\' 5"  (1.651 m)   Wt 226 lb 12.8  oz (102.9 kg)   BMI 37.74 kg/m  No LMP recorded. Patient has had a hysterectomy.  General:   Alert,  Well-developed, well-nourished, pleasant and cooperative in NAD Head:  Normocephalic and atraumatic. Eyes:  Sclera clear, no icterus.   Conjunctiva pink. Ears:  Normal auditory acuity. Nose:  No deformity, discharge, or lesions. Mouth:  No deformity or lesions,oropharynx pink & moist. Neck:  Supple; no masses or thyromegaly. Lungs:  Respirations even and unlabored.  Clear throughout to auscultation.   No wheezes, crackles, or rhonchi. No acute distress. Heart:  Regular rate and rhythm; no murmurs, clicks, rubs, or gallops. Abdomen:  Normal bowel sounds. Soft, obese, non-tender and non-distended without masses, hepatosplenomegaly or hernias noted.  No guarding or rebound tenderness.   Rectal: Not performed, perianal exam revealed mild perianal rash, no external hemorrhoids, nontender Msk:  Symmetrical without gross deformities. Good, equal movement & strength bilaterally. Pulses:  Normal pulses noted. Extremities:  No clubbing or edema.  No cyanosis. Neurologic:  Alert and oriented x3;  grossly normal neurologically. Skin:  Intact without significant lesions or  rashes. No jaundice. Psych:  Alert and cooperative. Normal mood and affect.  Imaging Studies: reviewed  Assessment and Plan:   Amber Ortiz is a 58 y.o. Caucasian female with obesity seen in consultation for painless blood per rectum secondary to internal hemorrhoids.  Patient underwent ligation of right anterior, right posterior and left lateral hemorrhoids in 04/2018 with no recurrence of rectal bleeding until 11/2018 which lasted for 2 weeks. Colonoscopy was unremarkable except for internal hemorrhoids.  She denies any other hemorrhoidal symptoms.  Due to recurrence of rectal bleeding that lasted for 2 weeks, patient underwent repeat ligation of the right posterior hemorrhoid.  Currently, no further bleeding.  Will defer hemorrhoid ligation at this time  Follow up as needed   [name], MD

## 2019-07-22 ENCOUNTER — Other Ambulatory Visit: Payer: Self-pay | Admitting: Family Medicine

## 2019-07-22 DIAGNOSIS — Z1231 Encounter for screening mammogram for malignant neoplasm of breast: Secondary | ICD-10-CM

## 2019-08-13 ENCOUNTER — Other Ambulatory Visit: Payer: Self-pay

## 2019-08-13 ENCOUNTER — Ambulatory Visit
Admission: RE | Admit: 2019-08-13 | Discharge: 2019-08-13 | Disposition: A | Discharge status: Home / Self Care | Payer: BC Managed Care – PPO | Source: Ambulatory Visit | Attending: Family Medicine | Admitting: Family Medicine

## 2019-08-13 DIAGNOSIS — Z1231 Encounter for screening mammogram for malignant neoplasm of breast: Secondary | ICD-10-CM | POA: Diagnosis not present

## 2019-08-15 ENCOUNTER — Other Ambulatory Visit: Payer: Self-pay | Admitting: Family Medicine

## 2019-08-15 DIAGNOSIS — N6310 Unspecified lump in the right breast, unspecified quadrant: Secondary | ICD-10-CM

## 2019-08-15 DIAGNOSIS — R928 Other abnormal and inconclusive findings on diagnostic imaging of breast: Secondary | ICD-10-CM

## 2019-08-22 ENCOUNTER — Ambulatory Visit
Admission: RE | Admit: 2019-08-22 | Discharge: 2019-08-22 | Disposition: A | Discharge status: Home / Self Care | Payer: BC Managed Care – PPO | Source: Ambulatory Visit | Attending: Family Medicine | Admitting: Family Medicine

## 2019-08-22 DIAGNOSIS — N6310 Unspecified lump in the right breast, unspecified quadrant: Secondary | ICD-10-CM

## 2019-08-22 DIAGNOSIS — R928 Other abnormal and inconclusive findings on diagnostic imaging of breast: Secondary | ICD-10-CM

## 2019-10-22 DIAGNOSIS — I700 Atherosclerosis of aorta: Secondary | ICD-10-CM | POA: Insufficient documentation

## 2019-11-25 IMAGING — CR DG CHEST 2V
2 series · 2 of 2 positions shown · non-contrast
Comparison: Radiographs November 12, 2016.

CLINICAL DATA: Cough.

EXAM:
CHEST - 2 VIEW

[chest pa]
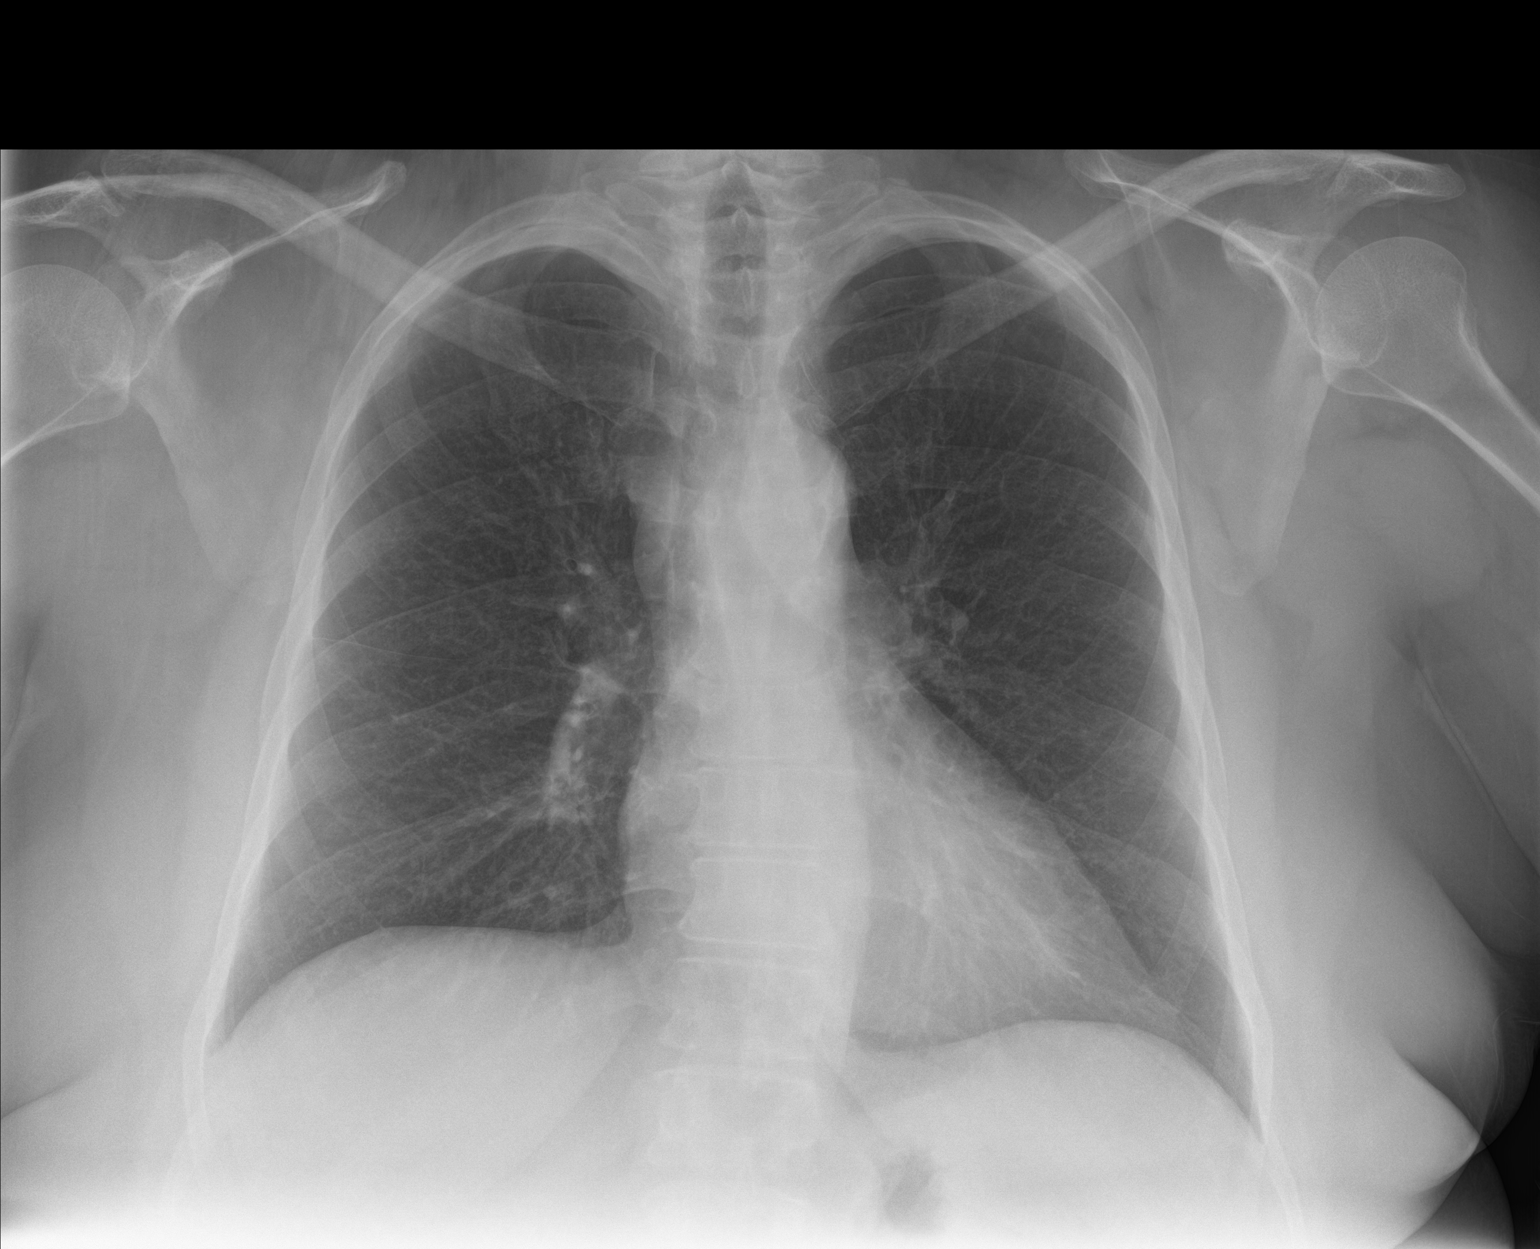

[chest lat]
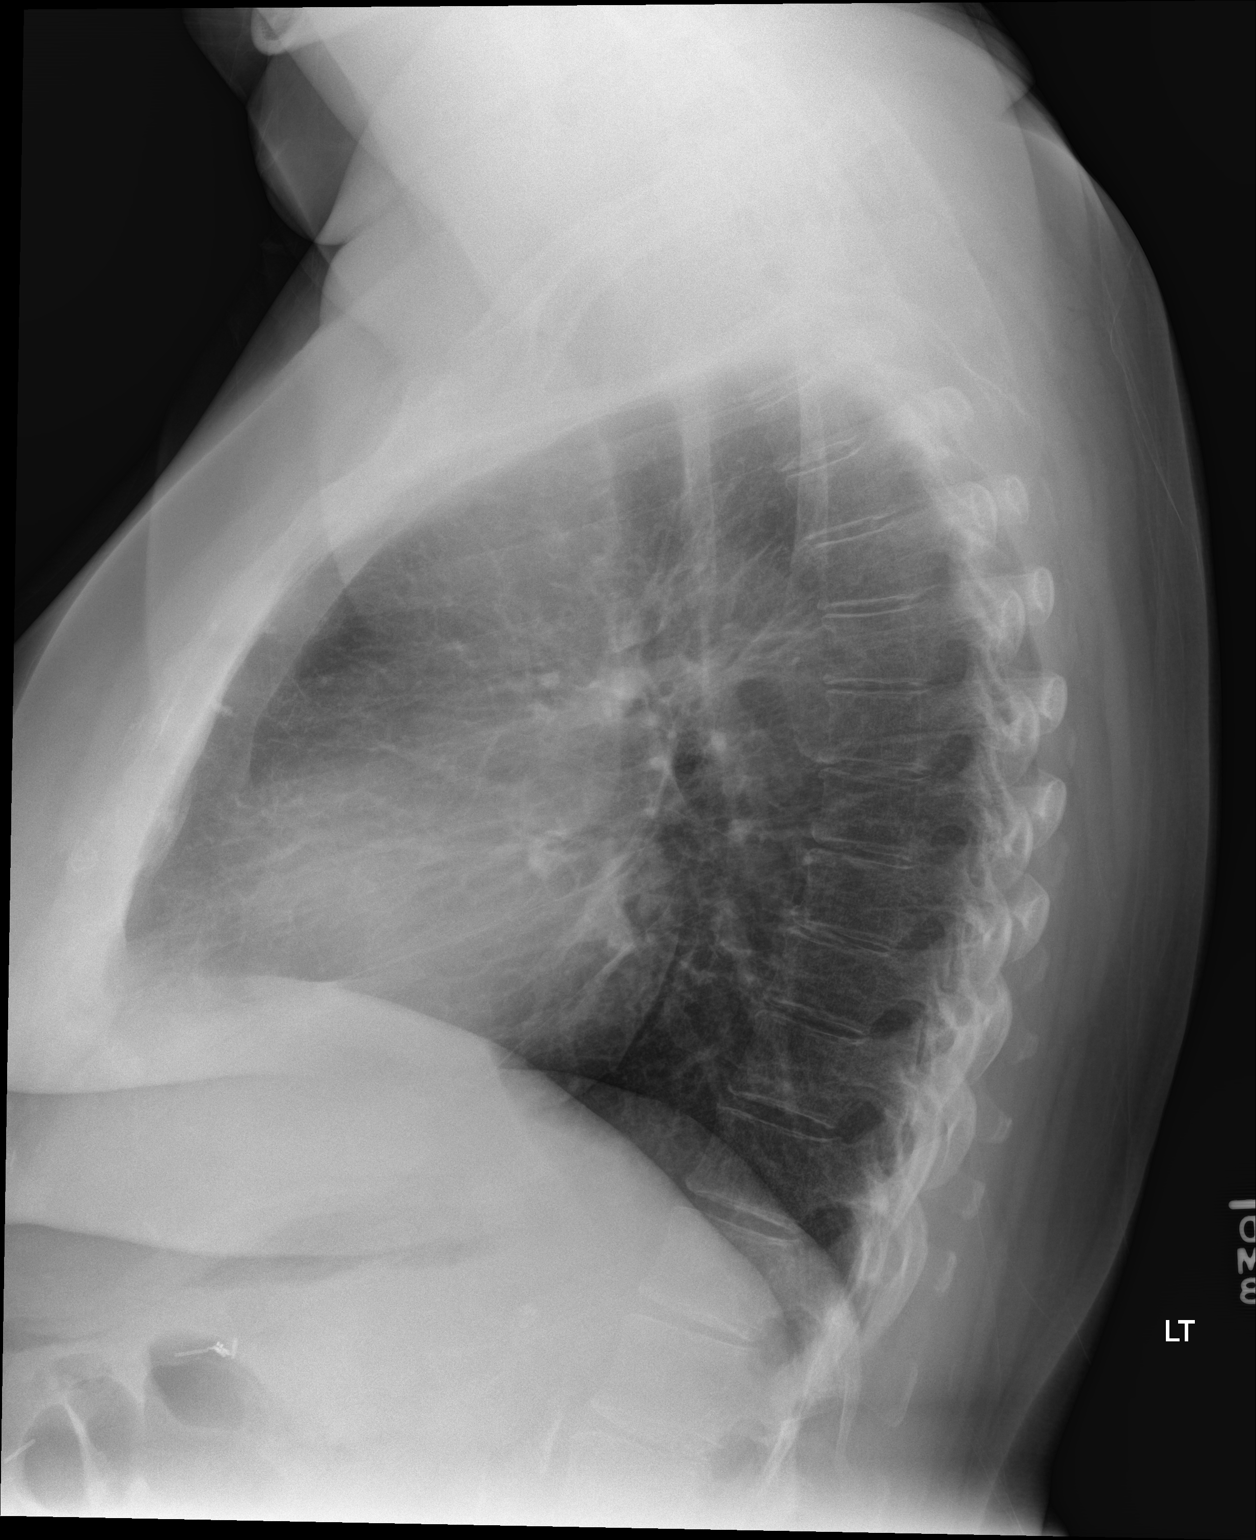

[2 of 2 positions shown; findings below may reference images not displayed]

FINDINGS: The heart size and mediastinal contours are within normal limits.
Both lungs are clear. The visualized skeletal structures are
unremarkable.
IMPRESSION: No active cardiopulmonary disease.

## 2020-07-21 ENCOUNTER — Other Ambulatory Visit: Payer: Self-pay | Admitting: Family Medicine

## 2020-07-21 DIAGNOSIS — Z1231 Encounter for screening mammogram for malignant neoplasm of breast: Secondary | ICD-10-CM

## 2020-08-25 ENCOUNTER — Ambulatory Visit
Admission: RE | Admit: 2020-08-25 | Discharge: 2020-08-25 | Disposition: A | Discharge status: Home / Self Care | Payer: BC Managed Care – PPO | Source: Ambulatory Visit | Attending: Family Medicine | Admitting: Family Medicine

## 2020-08-25 ENCOUNTER — Other Ambulatory Visit: Payer: Self-pay

## 2020-08-25 DIAGNOSIS — Z1231 Encounter for screening mammogram for malignant neoplasm of breast: Secondary | ICD-10-CM | POA: Insufficient documentation

## 2022-01-11 ENCOUNTER — Other Ambulatory Visit: Payer: Self-pay | Admitting: Family Medicine

## 2022-01-11 DIAGNOSIS — Z139 Encounter for screening, unspecified: Secondary | ICD-10-CM

## 2022-01-12 ENCOUNTER — Other Ambulatory Visit: Payer: Self-pay

## 2022-01-12 ENCOUNTER — Ambulatory Visit
Admission: RE | Admit: 2022-01-12 | Discharge: 2022-01-12 | Disposition: A | Discharge status: Home / Self Care | Payer: BC Managed Care – PPO | Source: Ambulatory Visit | Attending: Family Medicine | Admitting: Family Medicine

## 2022-01-12 DIAGNOSIS — Z139 Encounter for screening, unspecified: Secondary | ICD-10-CM

## 2022-08-30 ENCOUNTER — Ambulatory Visit (INDEPENDENT_AMBULATORY_CARE_PROVIDER_SITE_OTHER): Payer: BC Managed Care – PPO | Admitting: Gastroenterology

## 2022-08-30 ENCOUNTER — Encounter: Payer: Self-pay | Admitting: Gastroenterology

## 2022-08-30 ENCOUNTER — Other Ambulatory Visit: Payer: Self-pay

## 2022-08-30 VITALS — BP 152/81 | HR 80 | Temp 98.0°F | Ht 60.0 in | Wt 230.5 lb

## 2022-08-30 DIAGNOSIS — K625 Hemorrhage of anus and rectum: Secondary | ICD-10-CM

## 2022-08-30 DIAGNOSIS — K648 Other hemorrhoids: Secondary | ICD-10-CM | POA: Diagnosis not present

## 2022-08-30 NOTE — Progress Notes (Unsigned)

## 2022-08-30 NOTE — Progress Notes (Unsigned)
Amber Darby, MD [address]  [place]  [place], [place] [address]  Main: [phone]  Fax: [phone]    Gastroenterology Consultation  Referring Provider:     [name], MD Primary Care Physician:  [name], MD Primary Gastroenterologist:  Dr. Cephas Ortiz Reason for Consultation:     Rectal bleeding, internal hemorrhoids        HPI:   Amber Ortiz is a 61 y.o. female referred by Dr. Ellison Hughs Chrissie Noa, MD  for consultation & management of symptomatic hemorrhoids.  She underwent hemorrhoid ligation in 2020.  She now returns to undergo hemorrhoid ligation due to recurrence of hemorrhoidal symptoms.  She has noticed several bouts of painless rectal bleeding that has started last week.  Patient reports blood dripping into the toilet and she feels weak.  She denies any constipation.  She spends more than 10 minutes on the toilet.  NSAIDs: none  Antiplts/Anticoagulants/Anti thrombotics: none  GI Procedures: reports having had a colonoscopy about 5 years ago by [facility] clinic GI. She was told that they had to use pediatric colonoscope because of the scar tissue and she had significant pain postprocedure. She denies family history of colon cancer  Colonoscopy 04/12/2018 - One diminutive polyp in the transverse colon, removed with a cold biopsy forceps. Resected and retrieved. - Non-bleeding internal hemorrhoids. - The entire examined colon is normal.  DIAGNOSIS:  A.  COLON POLYP, TRANSVERSE; COLD BIOPSY:  - TUBULAR ADENOMA.  - NEGATIVE FOR HIGH-GRADE DYSPLASIA AND MALIGNANCY.   Past Medical History:  Diagnosis Date   Anxiety    Arrhythmia    Hypertension    Kidney stone    Psoriasis    Rectal bleeding 08/30/2018    Past Surgical History:  Procedure Laterality Date   ABDOMINAL HYSTERECTOMY     APPENDECTOMY     CESAREAN SECTION     CHOLECYSTECTOMY     COLONOSCOPY WITH PROPOFOL N/A 04/12/2018   Procedure: COLONOSCOPY WITH  PROPOFOL;  Surgeon: Amber Landsman, MD;  Location: [facility] ENDOSCOPY;  Service: Gastroenterology;  Laterality: N/A;     Current Outpatient Medications:    acitretin (SORIATANE) 25 MG capsule, Take 25 mg by mouth 3 (three) times a week., Disp: , Rfl:    Adalimumab (HUMIRA PEN) 40 MG/0.8ML PNKT, 80 mg SQ first dose then 40 mg every other week beginning 1 week after first dose., Disp: , Rfl:    ammonium lactate (AMLACTIN) 12 % cream, Apply 1 g topically as needed for dry skin., Disp: , Rfl:    clobetasol ointment (TEMOVATE) 0.05 %, APPLY TO AFFECTED AREA TWICE A DAY, Disp: , Rfl: 2   clonazePAM (KLONOPIN) 0.5 MG tablet, Take by mouth., Disp: , Rfl:    cyanocobalamin (VITAMIN B12) 1000 MCG tablet, Take by mouth., Disp: , Rfl:    escitalopram (LEXAPRO) 20 MG tablet, Take 20 mg by mouth at bedtime., Disp: , Rfl:    Halcinonide (HALOG) 0.1 % CREA, Apply 1 application topically daily as needed., Disp: , Rfl:    lisinopril-hydrochlorothiazide (ZESTORETIC) 10-12.5 MG tablet, Take 1 tablet by mouth daily., Disp: , Rfl:    metoprolol succinate (TOPROL-XL) 50 MG 24 hr tablet, Take 50 mg by mouth daily., Disp: , Rfl: 4   polyethylene glycol (MIRALAX / GLYCOLAX) packet, Take by mouth., Disp: , Rfl:    PROAIR RESPICLICK 440 (90 Base) MCG/ACT AEPB, INHALE 2 INHALATIONS INTO THE LUNGS EVERY 4 (FOUR) HOURS AS NEEDED FOR WHEEZING, Disp: ,  Rfl:    rosuvastatin (CRESTOR) 5 MG tablet, Take by mouth., Disp: , Rfl:    urea (CARMOL) 40 % CREA, Apply to thick scales at night time, Disp: , Rfl:    Vitamin D, Ergocalciferol, (DRISDOL) 50000 units CAPS capsule, Take 1 capsule by mouth every 7 (seven) days., Disp: , Rfl: 5   cetirizine-pseudoephedrine (ZYRTEC-D) 5-120 MG tablet, Take 1 tablet by mouth 2 (two) times daily. (Patient not taking: Reported on 01/11/2019), Disp: 30 tablet, Rfl: 0   chlorpheniramine-HYDROcodone (TUSSIONEX PENNKINETIC ER) 10-8 MG/5ML SUER, Take 5 mLs by mouth every 12 (twelve) hours as needed.  (Patient not taking: Reported on 01/29/2019), Disp: 115 mL, Rfl: 0   HUMIRA PEN 40 MG/0.4ML PNKT, , Disp: , Rfl:    HUMIRA PEN-PS/UV/ADOL HS START 40 MG/0.8ML PNKT, , Disp: , Rfl:    Hypodermic Needles-Disposable (MONOJECT MEDICATION TRANSF NDL) MISC, Med Name: for hemorrhoids (Patient not taking: Reported on 08/30/2022), Disp: , Rfl:    mupirocin ointment (BACTROBAN) 2 %, Apply 1 application topically 3 (three) times daily. (Patient not taking: Reported on 01/11/2019), Disp: 22 g, Rfl: 0   predniSONE (STERAPRED UNI-PAK 21 TAB) 10 MG (21) TBPK tablet, TAKE 1 TABLET (10 MG TOTAL) BY MOUTH AS DIRECTED 6 DAY TAPER, 6, 5, 4, 3, 2, 1 (Patient not taking: Reported on 08/30/2022), Disp: , Rfl:    Family History  Problem Relation Age of Onset   Breast cancer Sister 37   Kidney cancer Neg Hx    Kidney disease Neg Hx    Prostate cancer Neg Hx      Social History   Tobacco Use   Smoking status: Former    Types: Cigarettes    Quit date: 12/19/1986    Years since quitting: 35.7   Smokeless tobacco: Never  Vaping Use   Vaping Use: Never used  Substance Use Topics   Alcohol use: No   Drug use: No    Allergies as of 08/30/2022 - Review Complete 08/30/2022  Allergen Reaction Noted   Ibuprofen  11/08/2017   Cephalexin Rash    Ciprocin-fluocin-procin [fluocinolone] Rash 11/12/2016   Ciprofloxacin Rash 04/11/2018   Clarithromycin Rash 02/12/2015   Doxycycline calcium Rash 04/11/2018   Erythromycin Rash 11/12/2016   Erythromycin ethylsuccinate Rash    Penicillins Rash 11/12/2016   Rosuvastatin Rash 07/18/2018   Tetracyclines & related Rash 11/12/2016    Review of Systems:    All systems reviewed and negative except where noted in HPI.   Physical Exam:  BP (!) 152/81 (BP Location: Left Arm, Patient Position: Sitting, Cuff Size: Large)   Pulse 80   Temp 98 F (36.7 C) (Oral)   Ht 5' (1.524 m)   Wt 230 lb 8 oz (104.6 kg)   BMI 45.02 kg/m  No LMP recorded. Patient has had a  hysterectomy.  General:   Alert,  Well-developed, well-nourished, pleasant and cooperative in NAD Head:  Normocephalic and atraumatic. Eyes:  Sclera clear, no icterus.   Conjunctiva pink. Ears:  Normal auditory acuity. Nose:  No deformity, discharge, or lesions. Mouth:  No deformity or lesions,oropharynx pink & moist. Neck:  Supple; no masses or thyromegaly. Lungs:  Respirations even and unlabored.  Clear throughout to auscultation.   No wheezes, crackles, or rhonchi. No acute distress. Heart:  Regular rate and rhythm; no murmurs, clicks, rubs, or gallops. Abdomen:  Normal bowel sounds. Soft, obese, non-tender and non-distended without masses, hepatosplenomegaly or hernias noted.  No guarding or rebound tenderness.   Rectal: Not performed,  perianal exam revealed normal perianal skin, no external hemorrhoids, nontender Msk:  Symmetrical without gross deformities. Good, equal movement & strength bilaterally. Pulses:  Normal pulses noted. Extremities:  No clubbing or edema.  No cyanosis. Neurologic:  Alert and oriented x3;  grossly normal neurologically. Skin:  Intact without significant lesions or rashes. No jaundice. Psych:  Alert and cooperative. Normal mood and affect.  Imaging Studies: reviewed  Assessment and Plan:   EULALIE SPEIGHTS is a 61 y.o. Caucasian female with obesity seen in consultation for painless blood per rectum secondary to internal hemorrhoids.  Patient underwent ligation of right anterior, right posterior and left lateral hemorrhoids in 04/2018 with no recurrence of rectal bleeding until 11/2018 which lasted for 2 weeks.  Patient had recurrence of painless rectal bleeding last week Colonoscopy was unremarkable except for internal hemorrhoids.  She denies any other hemorrhoidal symptoms.  Proceed with hemorrhoid ligation today, risks and benefits discussed, consent obtained Check CBC as patient reports fatigue  Follow up in 2 weeks   Amber Darby, MD

## 2022-09-13 ENCOUNTER — Ambulatory Visit: Payer: BC Managed Care – PPO | Admitting: Gastroenterology

## 2022-12-02 ENCOUNTER — Other Ambulatory Visit: Payer: Self-pay

## 2022-12-02 ENCOUNTER — Ambulatory Visit (INDEPENDENT_AMBULATORY_CARE_PROVIDER_SITE_OTHER): Payer: BC Managed Care – PPO

## 2022-12-02 ENCOUNTER — Ambulatory Visit
Admission: RE | Admit: 2022-12-02 | Discharge: 2022-12-02 | Disposition: A | Discharge status: Home / Self Care | Payer: BC Managed Care – PPO | Source: Ambulatory Visit | Attending: Nurse Practitioner | Admitting: Nurse Practitioner

## 2022-12-02 VITALS — BP 122/68 | HR 72 | Temp 98.9°F | Resp 19

## 2022-12-02 DIAGNOSIS — N393 Stress incontinence (female) (male): Secondary | ICD-10-CM

## 2022-12-02 DIAGNOSIS — R059 Cough, unspecified: Secondary | ICD-10-CM

## 2022-12-02 DIAGNOSIS — R051 Acute cough: Secondary | ICD-10-CM

## 2022-12-02 MED ORDER — BENZONATATE 100 MG PO CAPS: 100.0000 mg | ORAL_CAPSULE | Freq: Three times a day (TID) | ORAL | 0 refills | Status: DC

## 2022-12-02 NOTE — Discharge Instructions (Addendum)
Your chest xray is negative for pneumonia Coughs can last up to 3 months with some respiratory conditions.  I have prescribed you Benzonatate 100 mg every 8 hours which will be effective for your cough.  If the symptoms persist follow up with PCP .

## 2022-12-02 NOTE — ED Provider Notes (Signed)
[facility]    CSN: [id number] Arrival date & time: 12/02/22  8338      History   Chief Complaint Chief Complaint  Patient presents with   Cough    Entered by patient    HPI Amber Ortiz is a 61 y.o. female.   HPI  She is in today with persistent cough, sore throat,. She was treated for URI with Z pac, steroid and RX cough medication with codeine. Her symptoms improved. She was advised to be evaluated for pnx.  She is having stress incontinence . Denies fever, chills, nasal congestion, sneezing, runny nose,  new loss of smell or taste, shortness of breath, chest pain, nausea, or diarrhea.This has been going on for 10 days. Exposure negative COVID/Influenza/Strep.   Past Medical History:  Diagnosis Date   Anxiety    Arrhythmia    Hypertension    Kidney stone    Psoriasis    Rectal bleeding 08/30/2018    Patient Active Problem List   Diagnosis Date Noted   Aortic atherosclerosis ([place]) 10/22/2019   Psoriatic arthritis ([place]) 12/26/2017   Swelling of finger of right hand 12/26/2017   Psoriasis, unspecified 01/13/2017   Essential hypertension    Other hyperlipidemia    Morbid obesity ([place])    Chest pain, rule out acute myocardial infarction 11/12/2016   Anxiety 07/16/2014   Depression 07/16/2014   Diabetes mellitus type 2, uncomplicated ([place]) 25/04/3975   Vitamin D deficiency, unspecified 07/16/2014    Past Surgical History:  Procedure Laterality Date   ABDOMINAL HYSTERECTOMY     APPENDECTOMY     CESAREAN SECTION     CHOLECYSTECTOMY     COLONOSCOPY WITH PROPOFOL N/A 04/12/2018   Procedure: COLONOSCOPY WITH PROPOFOL;  Surgeon: Lin Landsman, MD;  Location: [facility] ENDOSCOPY;  Service: Gastroenterology;  Laterality: N/A;    OB History   No obstetric history on file.      Home Medications    Prior to Admission medications   Medication Sig Start Date End Date Taking? Authorizing Provider  benzonatate (TESSALON) 100 MG capsule Take 1  capsule (100 mg total) by mouth every 8 (eight) hours. 12/02/22  Yes Vevelyn Francois, NP  acitretin (SORIATANE) 25 MG capsule Take 25 mg by mouth 3 (three) times a week. 08/01/22   [provider]  Adalimumab (HUMIRA PEN) 40 MG/0.8ML PNKT 80 mg SQ first dose then 40 mg every other week beginning 1 week after first dose. 02/13/18   [provider]  ammonium lactate (AMLACTIN) 12 % cream Apply 1 g topically as needed for dry skin.    [provider]  clobetasol ointment (TEMOVATE) 0.05 % APPLY TO AFFECTED AREA TWICE A DAY 03/29/18   [provider]  clonazePAM (KLONOPIN) 0.5 MG tablet Take by mouth. 08/21/19   [provider]  cyanocobalamin (VITAMIN B12) 1000 MCG tablet Take by mouth.    [provider]  escitalopram (LEXAPRO) 20 MG tablet Take 20 mg by mouth at bedtime.    [provider]  Halcinonide (HALOG) 0.1 % CREA Apply 1 application topically daily as needed.    [provider]  HUMIRA PEN-PS/UV/ADOL HS START 40 MG/0.8ML PNKT  02/13/18   [provider]  lisinopril-hydrochlorothiazide (ZESTORETIC) 10-12.5 MG tablet Take 1 tablet by mouth daily. 03/02/22   [provider]  metoprolol succinate (TOPROL-XL) 50 MG 24 hr tablet Take 50 mg by mouth daily. 09/23/16   [provider]  polyethylene glycol (MIRALAX / GLYCOLAX) packet  Take by mouth. 04/10/18   [provider]  PROAIR RESPICLICK 326 (90 Base) MCG/ACT AEPB INHALE 2 INHALATIONS INTO THE LUNGS EVERY 4 (FOUR) HOURS AS NEEDED FOR WHEEZING 01/18/19   [provider]  rosuvastatin (CRESTOR) 5 MG tablet Take by mouth. 01/20/21   [provider]  urea (CARMOL) 40 % CREA Apply to thick scales at night time 06/04/18   [provider]  Vitamin D, Ergocalciferol, (DRISDOL) 50000 units CAPS capsule Take 1 capsule by mouth every 7 (seven) days. 09/22/16   [provider]    Family History Family History  Problem Relation  Age of Onset   Breast cancer Sister 60   Kidney cancer Neg Hx    Kidney disease Neg Hx    Prostate cancer Neg Hx     Social History Social History   Tobacco Use   Smoking status: Former    Types: Cigarettes    Quit date: 12/19/1986    Years since quitting: 35.9   Smokeless tobacco: Never  Vaping Use   Vaping Use: Never used  Substance Use Topics   Alcohol use: No   Drug use: No     Allergies   Ibuprofen, Cephalexin, Ciprocin-fluocin-procin [fluocinolone], Ciprofloxacin, Clarithromycin, Doxycycline calcium, Erythromycin, Erythromycin ethylsuccinate, Penicillins, Rosuvastatin, and Tetracyclines & related   Review of Systems Review of Systems   Physical Exam Triage Vital Signs ED Triage Vitals  Enc Vitals Group     BP 12/02/22 0954 122/68     Pulse Rate 12/02/22 0954 72     Resp 12/02/22 0954 19     Temp 12/02/22 0954 98.9 F (37.2 C)     Temp Source 12/02/22 0954 Oral     SpO2 12/02/22 0954 96 %     Weight --      Height --      Head Circumference --      Peak Flow --      Pain Score 12/02/22 0952 0     Pain Loc --      Pain Edu? --      Excl. in [place]? --    No data found.  Updated Vital Signs BP 122/68 (BP Location: Right Arm)   Pulse 72   Temp 98.9 F (37.2 C) (Oral)   Resp 19   SpO2 96%   Visual Acuity Right Eye Distance:   Left Eye Distance:   Bilateral Distance:    Right Eye Near:   Left Eye Near:    Bilateral Near:     Physical Exam Constitutional:      Appearance: She is obese.  HENT:     Head: Normocephalic and atraumatic.     Comments: Increased thickness to lateral face bilaterally    Right Ear: Tympanic membrane normal.     Left Ear: Tympanic membrane normal.     Nose: Nose normal.     Mouth/Throat:     Mouth: Mucous membranes are moist.  Eyes:     Pupils: Pupils are equal, round, and reactive to light.  Cardiovascular:     Rate and Rhythm: Normal rate.     Pulses: Normal pulses.  Pulmonary:     Effort: Pulmonary effort is  normal.     Breath sounds: Normal breath sounds.     Comments: cough Abdominal:     Comments: Increased abdominal girth  Musculoskeletal:     Cervical back: Normal range of motion.  Neurological:     Mental Status: She is alert.  UC Treatments / Results  Labs (all labs ordered are listed, but only abnormal results are displayed) Labs Reviewed - No data to display  EKG   Radiology DG Chest 2 View  Result Date: 12/02/2022 CLINICAL DATA:  Cough for 10 days EXAM: CHEST - 2 VIEW COMPARISON:  10/22/2018 FINDINGS: Normal heart size, mediastinal contours, and pulmonary vascularity. Lungs clear. No pulmonary infiltrate, pleural effusion, or pneumothorax. Osseous structures unremarkable. IMPRESSION: No acute abnormalities. Aortic Atherosclerosis (ICD10-I70.0). Electronically Signed   By: Lavonia Dana M.D.   On: 12/02/2022 11:22    Procedures Procedures (including critical care time)  Medications Ordered in UC Medications - No data to display  Initial Impression / Assessment and Plan / UC Course  I have reviewed the triage vital signs and the nursing notes.  Pertinent labs & imaging results that were available during my care of the patient were reviewed by me and considered in my medical decision making (see chart for details).     Cough   Final Clinical Impressions(s) / UC Diagnoses   Final diagnoses:  Acute cough  Stress incontinence     Discharge Instructions      Your chest xray is negative for pneumonia Coughs can last up to 3 months with some respiratory conditions.  I have prescribed you Benzonatate 100 mg every 8 hours which will be effective for your cough.  If the symptoms persist follow up with PCP .       ED Prescriptions     Medication Sig Dispense Auth. Provider   benzonatate (TESSALON) 100 MG capsule Take 1 capsule (100 mg total) by mouth every 8 (eight) hours. 21 capsule Vevelyn Francois, NP      PDMP not reviewed this encounter.   Dionisio David [place], NP 12/02/22 1141

## 2022-12-02 NOTE — ED Triage Notes (Addendum)
Pt is here with a cough that started 10 days ago, pt has taken OTC meds to relieve discomfort. Pt states she had a dr. Hilaria Ota 11/18/2022, she had taking meds that she was prescribed.

## 2023-01-11 ENCOUNTER — Other Ambulatory Visit: Payer: Self-pay | Admitting: Family Medicine

## 2023-01-11 DIAGNOSIS — Z1231 Encounter for screening mammogram for malignant neoplasm of breast: Secondary | ICD-10-CM

## 2023-01-18 ENCOUNTER — Ambulatory Visit
Admission: RE | Admit: 2023-01-18 | Discharge: 2023-01-18 | Disposition: A | Discharge status: Home / Self Care | Payer: PRIVATE HEALTH INSURANCE | Source: Ambulatory Visit | Attending: Family Medicine | Admitting: Family Medicine

## 2023-01-18 DIAGNOSIS — Z1231 Encounter for screening mammogram for malignant neoplasm of breast: Secondary | ICD-10-CM

## 2024-02-09 ENCOUNTER — Other Ambulatory Visit: Payer: Self-pay | Admitting: Family Medicine

## 2024-02-09 DIAGNOSIS — Z1231 Encounter for screening mammogram for malignant neoplasm of breast: Secondary | ICD-10-CM

## 2024-02-19 ENCOUNTER — Ambulatory Visit
Admission: RE | Admit: 2024-02-19 | Discharge: 2024-02-19 | Disposition: A | Discharge status: Home / Self Care | Payer: BC Managed Care – PPO | Source: Ambulatory Visit | Attending: Family Medicine | Admitting: Family Medicine

## 2024-02-19 DIAGNOSIS — Z1231 Encounter for screening mammogram for malignant neoplasm of breast: Secondary | ICD-10-CM

## 2024-08-06 ENCOUNTER — Other Ambulatory Visit: Payer: Self-pay | Admitting: Orthopedic Surgery

## 2024-08-06 DIAGNOSIS — M2391 Unspecified internal derangement of right knee: Secondary | ICD-10-CM

## 2024-08-06 DIAGNOSIS — S8991XA Unspecified injury of right lower leg, initial encounter: Secondary | ICD-10-CM

## 2024-08-06 DIAGNOSIS — M2351 Chronic instability of knee, right knee: Secondary | ICD-10-CM

## 2024-08-07 ENCOUNTER — Ambulatory Visit
Admission: RE | Admit: 2024-08-07 | Discharge: 2024-08-07 | Disposition: A | Discharge status: Home / Self Care | Source: Ambulatory Visit | Attending: Orthopedic Surgery | Admitting: Orthopedic Surgery

## 2024-08-07 DIAGNOSIS — M2351 Chronic instability of knee, right knee: Secondary | ICD-10-CM

## 2024-08-07 DIAGNOSIS — M2391 Unspecified internal derangement of right knee: Secondary | ICD-10-CM

## 2024-08-07 DIAGNOSIS — S8991XA Unspecified injury of right lower leg, initial encounter: Secondary | ICD-10-CM

## 2024-08-12 ENCOUNTER — Other Ambulatory Visit: Payer: Self-pay | Admitting: Orthopedic Surgery

## 2024-08-13 ENCOUNTER — Encounter: Payer: Self-pay | Admitting: Orthopedic Surgery

## 2024-08-15 NOTE — Anesthesia Preprocedure Evaluation (Signed)
 Anesthesia Evaluation  Patient identified by MRN, date of birth, ID band Patient awake    Reviewed: Allergy & Precautions, NPO status , Patient's Chart, lab work & pertinent test results  History of Anesthesia Complications Negative for: history of anesthetic complications  Airway Mallampati: III   Neck ROM: Full    Dental no notable dental hx.    Pulmonary asthma , sleep apnea and Continuous Positive Airway Pressure Ventilation , former smoker (quit 1988)   Pulmonary exam normal breath sounds clear to auscultation       Cardiovascular hypertension, Normal cardiovascular exam Rhythm:Regular Rate:Normal  Echo 09/13/21: Normal Stress Echocardiogram  NO VALVULAR STENOSIS NOTED     Neuro/Psych  PSYCHIATRIC DISORDERS Anxiety     negative neurological ROS     GI/Hepatic negative GI ROS,,,NAFLD   Endo/Other  diabetes, Type 2  Class 3 obesity  Renal/GU Renal disease (nephrolithiasis)     Musculoskeletal  (+) Arthritis ,    Abdominal   Peds  Hematology negative hematology ROS (+)   Anesthesia Other Findings   Reproductive/Obstetrics                              Anesthesia Physical Anesthesia Plan  ASA: 3  Anesthesia Plan: General   Post-op Pain Management:    Induction: Intravenous  PONV Risk Score and Plan: 3 and Ondansetron , Dexamethasone  and Treatment may vary due to age or medical condition  Airway Management Planned: LMA  Additional Equipment:   Intra-op Plan:   Post-operative Plan: Extubation in OR  Informed Consent: I have reviewed the patients History and Physical, chart, labs and discussed the procedure including the risks, benefits and alternatives for the proposed anesthesia with the patient or authorized representative who has indicated his/her understanding and acceptance.     Dental advisory given  Plan Discussed with: CRNA  Anesthesia Plan Comments: (Patient  consented for risks of anesthesia including but not limited to:  - adverse reactions to medications - damage to eyes, teeth, lips or other oral mucosa - nerve damage due to positioning  - sore throat or hoarseness - damage to heart, brain, nerves, lungs, other parts of body or loss of life  Informed patient about role of CRNA in peri- and intra-operative care.  Patient voiced understanding.)         Anesthesia Quick Evaluation

## 2024-08-15 NOTE — Discharge Instructions (Signed)
 POLAR CARE INFORMATION  MassAdvertisement.it  How to use [employer] Polar Care [facility] Therapy System?  YouTube   ShippingScam.co.uk  OPERATING INSTRUCTIONS  Start the product With dry hands, connect the transformer to the electrical connection located on the top of the cooler. Next, plug the transformer into an appropriate electrical outlet. The unit will automatically start running at this point.  To stop the pump, disconnect electrical power.  Unplug to stop the product when not in use. Unplugging the Polar Care unit turns it off. Always unplug immediately after use. Never leave it plugged in while unattended. Remove pad.    FIRST ADD WATER TO FILL LINE, THEN ICE---Replace ice when existing ice is almost melted  1 Discuss Treatment with your Licensed Health Care Practitioner and Use Only as Prescribed 2 Apply Insulation Barrier & Cold Therapy Pad 3 Check for Moisture 4 Inspect Skin Regularly  Tips and Trouble Shooting Usage Tips 1. Use cubed or chunked ice for optimal performance. 2. It is recommended to drain the Pad between uses. To drain the pad, hold the Pad upright with the hose pointed toward the ground. Depress the black plunger and allow water to drain out. 3. You may disconnect the Pad from the unit without removing the pad from the affected area by depressing the silver tabs on the hose coupling and gently pulling the hoses apart. The Pad and unit will seal itself and will not leak. Note: Some dripping during release is normal. 4. DO NOT RUN PUMP WITHOUT WATER! The pump in this unit is designed to run with water. Running the unit without water will cause permanent damage to the pump. 5. Unplug unit before removing lid.  TROUBLESHOOTING GUIDE Pump not running, Water not flowing to the pad, Pad is not getting cold 1. Make sure the transformer is plugged into the wall outlet. 2. Confirm that the ice and water are filled to the indicated levels. 3. Make sure  there are no kinks in the pad. 4. Gently pull on the blue tube to make sure the tube/pad junction is straight. 5. Remove the pad from the treatment site and ll it while the pad is lying at; then reapply. 6. Confirm that the pad couplings are securely attached to the unit. Listen for the double clicks (Figure 1) to confirm the pad couplings are securely attached.  Leaks    Note: Some condensation on the lines, controller, and pads is unavoidable, especially in warmer climates. 1. If using a [employer] Polar Care Cold Therapy unit with a detachable Cold Therapy Pad, and a leak exists (other than condensation on the lines) disconnect the pad couplings. Make sure the silver tabs on the couplings are depressed before reconnecting the pad to the pump hose; then confirm both sides of the coupling are properly clicked in. 2. If the coupling continues to leak or a leak is detected in the pad itself, stop using it and call [employer] Customer Care at [phone].  Cleaning After use, empty and dry the unit with a soft cloth. Warm water and mild detergent may be used occasionally to clean the pump and tubes.  WARNING: The Polar Care Cube can be cold enough to cause serious injury, including full skin necrosis. Follow these Operating Instructions, and carefully read the Product Insert (see pouch on side of unit) and the Cold Therapy Pad Fitting Instructions (provided with each Cold Therapy Pad) prior to use.

## 2024-08-16 ENCOUNTER — Ambulatory Visit: Payer: Self-pay | Admitting: Anesthesiology

## 2024-08-16 ENCOUNTER — Ambulatory Visit
Admission: RE | Admit: 2024-08-16 | Discharge: 2024-08-16 | Disposition: A | Discharge status: Home / Self Care | Attending: Orthopedic Surgery | Admitting: Orthopedic Surgery

## 2024-08-16 ENCOUNTER — Other Ambulatory Visit: Payer: Self-pay

## 2024-08-16 ENCOUNTER — Encounter
Admission: RE | Disposition: A | Discharge status: Home / Self Care | Payer: Self-pay | Source: Home / Self Care | Attending: Orthopedic Surgery

## 2024-08-16 ENCOUNTER — Encounter: Payer: Self-pay | Admitting: Orthopedic Surgery

## 2024-08-16 DIAGNOSIS — E66813 Obesity, class 3: Secondary | ICD-10-CM | POA: Diagnosis not present

## 2024-08-16 DIAGNOSIS — Z6841 Body Mass Index (BMI) 40.0 and over, adult: Secondary | ICD-10-CM | POA: Diagnosis not present

## 2024-08-16 DIAGNOSIS — F419 Anxiety disorder, unspecified: Secondary | ICD-10-CM | POA: Diagnosis not present

## 2024-08-16 DIAGNOSIS — G4730 Sleep apnea, unspecified: Secondary | ICD-10-CM | POA: Insufficient documentation

## 2024-08-16 DIAGNOSIS — K760 Fatty (change of) liver, not elsewhere classified: Secondary | ICD-10-CM | POA: Diagnosis not present

## 2024-08-16 DIAGNOSIS — S8991XA Unspecified injury of right lower leg, initial encounter: Secondary | ICD-10-CM | POA: Diagnosis present

## 2024-08-16 DIAGNOSIS — W108XXA Fall (on) (from) other stairs and steps, initial encounter: Secondary | ICD-10-CM | POA: Insufficient documentation

## 2024-08-16 DIAGNOSIS — Z79899 Other long term (current) drug therapy: Secondary | ICD-10-CM | POA: Insufficient documentation

## 2024-08-16 DIAGNOSIS — S83281A Other tear of lateral meniscus, current injury, right knee, initial encounter: Secondary | ICD-10-CM | POA: Insufficient documentation

## 2024-08-16 DIAGNOSIS — S83231A Complex tear of medial meniscus, current injury, right knee, initial encounter: Secondary | ICD-10-CM | POA: Diagnosis not present

## 2024-08-16 DIAGNOSIS — J45909 Unspecified asthma, uncomplicated: Secondary | ICD-10-CM | POA: Insufficient documentation

## 2024-08-16 DIAGNOSIS — I10 Essential (primary) hypertension: Secondary | ICD-10-CM | POA: Insufficient documentation

## 2024-08-16 DIAGNOSIS — E119 Type 2 diabetes mellitus without complications: Secondary | ICD-10-CM | POA: Insufficient documentation

## 2024-08-16 DIAGNOSIS — Z87891 Personal history of nicotine dependence: Secondary | ICD-10-CM | POA: Insufficient documentation

## 2024-08-16 DIAGNOSIS — M1711 Unilateral primary osteoarthritis, right knee: Secondary | ICD-10-CM | POA: Diagnosis not present

## 2024-08-16 HISTORY — DX: Personal history of urinary calculi: Z87.442

## 2024-08-16 HISTORY — DX: Unspecified osteoarthritis, unspecified site: M19.90

## 2024-08-16 HISTORY — DX: Fatty (change of) liver, not elsewhere classified: K76.0

## 2024-08-16 HISTORY — PX: KNEE ARTHROSCOPY WITH MEDIAL MENISECTOMY: SHX5651

## 2024-08-16 HISTORY — DX: Unspecified asthma, uncomplicated: J45.909

## 2024-08-16 HISTORY — DX: Sleep apnea, unspecified: G47.30

## 2024-08-16 HISTORY — DX: Prediabetes: R73.03

## 2024-08-16 SURGERY — ARTHROSCOPY, KNEE, WITH MEDIAL MENISCECTOMY
Anesthesia: General | Site: Knee | Laterality: Right

## 2024-08-16 MED ORDER — LIDOCAINE HCL (PF) 2 % IJ SOLN
INTRAMUSCULAR | Status: AC
  Filled 2024-08-16: qty 5

## 2024-08-16 MED ORDER — PROPOFOL 10 MG/ML IV BOLUS
INTRAVENOUS | Status: AC
  Filled 2024-08-16: qty 40

## 2024-08-16 MED ORDER — MIDAZOLAM HCL 5 MG/5ML IJ SOLN
INTRAMUSCULAR | Status: DC | PRN
  Administered 2024-08-16: 2 mg via INTRAVENOUS

## 2024-08-16 MED ORDER — CEFAZOLIN SODIUM-DEXTROSE 2-3 GM-%(50ML) IV SOLR
INTRAVENOUS | Status: AC
  Filled 2024-08-16: qty 50

## 2024-08-16 MED ORDER — VANCOMYCIN HCL IN DEXTROSE 1-5 GM/200ML-% IV SOLN: 1000.0000 mg | INTRAVENOUS | Status: DC

## 2024-08-16 MED ORDER — LACTATED RINGERS IR SOLN
Status: DC | PRN
  Administered 2024-08-16: 12000 mL

## 2024-08-16 MED ORDER — ONDANSETRON HCL 4 MG/2ML IJ SOLN
INTRAMUSCULAR | Status: AC
  Filled 2024-08-16: qty 2

## 2024-08-16 MED ORDER — ACETAMINOPHEN 10 MG/ML IV SOLN
1000.0000 mg | Freq: Once | INTRAVENOUS | Status: DC | PRN
  Administered 2024-08-16: 1000 mg via INTRAVENOUS

## 2024-08-16 MED ORDER — FENTANYL CITRATE PF 50 MCG/ML IJ SOSY
25.0000 ug | PREFILLED_SYRINGE | INTRAMUSCULAR | Status: DC | PRN
  Administered 2024-08-16: 25 ug via INTRAVENOUS

## 2024-08-16 MED ORDER — HYDROCODONE-ACETAMINOPHEN 5-325 MG PO TABS: 1.0000 | ORAL_TABLET | ORAL | 0 refills | Status: AC | PRN

## 2024-08-16 MED ORDER — ASPIRIN 325 MG PO TBEC: 325.0000 mg | DELAYED_RELEASE_TABLET | Freq: Every day | ORAL | 0 refills | Status: AC

## 2024-08-16 MED ORDER — OXYCODONE HCL 5 MG/5ML PO SOLN: 5.0000 mg | Freq: Once | ORAL | Status: AC | PRN

## 2024-08-16 MED ORDER — GENTAMICIN SULFATE 40 MG/ML IJ SOLN: 5.0000 mg/kg | INTRAVENOUS | Status: DC

## 2024-08-16 MED ORDER — OXYCODONE HCL 5 MG PO TABS
5.0000 mg | ORAL_TABLET | Freq: Once | ORAL | Status: AC | PRN
  Administered 2024-08-16: 5 mg via ORAL

## 2024-08-16 MED ORDER — LIDOCAINE-EPINEPHRINE 1 %-1:100000 IJ SOLN
INTRAMUSCULAR | Status: DC | PRN
  Administered 2024-08-16: 10 mL via INTRAMUSCULAR
  Administered 2024-08-16: 3 mL

## 2024-08-16 MED ORDER — DEXMEDETOMIDINE HCL IN NACL 80 MCG/20ML IV SOLN
INTRAVENOUS | Status: DC | PRN
  Administered 2024-08-16: 8 ug via INTRAVENOUS

## 2024-08-16 MED ORDER — GLYCOPYRROLATE 0.2 MG/ML IJ SOLN
INTRAMUSCULAR | Status: DC | PRN
  Administered 2024-08-16: .1 mg via INTRAVENOUS

## 2024-08-16 MED ORDER — DIPHENHYDRAMINE HCL 50 MG/ML IJ SOLN
INTRAMUSCULAR | Status: DC | PRN
  Administered 2024-08-16: 12.5 mg via INTRAVENOUS

## 2024-08-16 MED ORDER — ONDANSETRON 4 MG PO TBDP: 4.0000 mg | ORAL_TABLET | Freq: Three times a day (TID) | ORAL | 0 refills | Status: AC | PRN

## 2024-08-16 MED ORDER — FENTANYL CITRATE (PF) 100 MCG/2ML IJ SOLN
INTRAMUSCULAR | Status: DC | PRN
  Administered 2024-08-16 (×3): 25 ug via INTRAVENOUS

## 2024-08-16 MED ORDER — DEXAMETHASONE SODIUM PHOSPHATE 4 MG/ML IJ SOLN
INTRAMUSCULAR | Status: AC
  Filled 2024-08-16: qty 1

## 2024-08-16 MED ORDER — FENTANYL CITRATE (PF) 100 MCG/2ML IJ SOLN
INTRAMUSCULAR | Status: AC
  Filled 2024-08-16: qty 2

## 2024-08-16 MED ORDER — DEXMEDETOMIDINE HCL IN NACL 80 MCG/20ML IV SOLN
INTRAVENOUS | Status: AC
Start: 2024-08-16 — End: 2024-08-16
  Filled 2024-08-16: qty 20

## 2024-08-16 MED ORDER — VANCOMYCIN HCL 500 MG IV SOLR
INTRAVENOUS | Status: AC
  Filled 2024-08-16: qty 20

## 2024-08-16 MED ORDER — ACETAMINOPHEN 10 MG/ML IV SOLN
INTRAVENOUS | Status: AC
  Filled 2024-08-16: qty 100

## 2024-08-16 MED ORDER — DIPHENHYDRAMINE HCL 50 MG/ML IJ SOLN
INTRAMUSCULAR | Status: AC
Start: 2024-08-16 — End: 2024-08-16
  Filled 2024-08-16: qty 1

## 2024-08-16 MED ORDER — DEXMEDETOMIDINE HCL 200 MCG/2ML IV SOLN
INTRAVENOUS | Status: AC
  Filled 2024-08-16: qty 2

## 2024-08-16 MED ORDER — ONDANSETRON HCL 4 MG/2ML IJ SOLN: 4.0000 mg | Freq: Once | INTRAMUSCULAR | Status: DC | PRN

## 2024-08-16 MED ORDER — PROPOFOL 10 MG/ML IV BOLUS
INTRAVENOUS | Status: DC | PRN
Start: 2024-08-16 — End: 2024-08-16
  Administered 2024-08-16: 150 mg via INTRAVENOUS

## 2024-08-16 MED ORDER — LACTATED RINGERS IV SOLN: INTRAVENOUS | Status: DC

## 2024-08-16 MED ORDER — OXYCODONE HCL 5 MG PO TABS
ORAL_TABLET | ORAL | Status: AC
  Filled 2024-08-16: qty 1

## 2024-08-16 MED ORDER — ONDANSETRON HCL 4 MG/2ML IJ SOLN
INTRAMUSCULAR | Status: DC | PRN
  Administered 2024-08-16: 4 mg via INTRAVENOUS

## 2024-08-16 MED ORDER — LIDOCAINE HCL (CARDIAC) PF 100 MG/5ML IV SOSY
PREFILLED_SYRINGE | INTRAVENOUS | Status: DC | PRN
  Administered 2024-08-16: 100 mg via INTRATRACHEAL

## 2024-08-16 MED ORDER — CEFAZOLIN SODIUM-DEXTROSE 2-4 GM/100ML-% IV SOLN
2.0000 g | Freq: Once | INTRAVENOUS | Status: AC
  Administered 2024-08-16: 2 g via INTRAVENOUS

## 2024-08-16 MED ORDER — ACETAMINOPHEN 500 MG PO TABS: 1000.0000 mg | ORAL_TABLET | Freq: Three times a day (TID) | ORAL | 2 refills | Status: AC

## 2024-08-16 MED ORDER — MIDAZOLAM HCL 2 MG/2ML IJ SOLN
INTRAMUSCULAR | Status: AC
  Filled 2024-08-16: qty 2

## 2024-08-16 SURGICAL SUPPLY — 26 items
BLADE FULL RADIUS 3.5 (BLADE) ×1 IMPLANT
BLADE SURG SZ11 CARB STEEL (BLADE) ×1 IMPLANT
BNDG COHESIVE 4X5 TAN STRL LF (GAUZE/BANDAGES/DRESSINGS) ×1 IMPLANT
BNDG ESMARCH 6X12 STRL LF (GAUZE/BANDAGES/DRESSINGS) ×1 IMPLANT
CHLORAPREP W/TINT 26 (MISCELLANEOUS) ×1 IMPLANT
COOLER POLAR GLACIER W/PUMP (MISCELLANEOUS) ×1 IMPLANT
COVER LIGHT HANDLE UNIVERSAL (MISCELLANEOUS) ×3 IMPLANT
DRAPE EXTREMITY T 121X128X90 (DISPOSABLE) ×1 IMPLANT
DRAPE IMP U-DRAPE 54X76 (DRAPES) ×1 IMPLANT
GAUZE SPONGE 4X4 12PLY STRL (GAUZE/BANDAGES/DRESSINGS) ×1 IMPLANT
GLOVE SRG 8 PF TXTR STRL LF DI (GLOVE) ×1 IMPLANT
GLOVE SURG SS PI 7.5 STRL IVOR (GLOVE) ×1 IMPLANT
GOWN STRL REUS W/ TWL LRG LVL3 (GOWN DISPOSABLE) ×1 IMPLANT
IV LR IRRIG 3000ML ARTHROMATIC (IV SOLUTION) ×2 IMPLANT
KIT TURNOVER KIT A (KITS) ×1 IMPLANT
MANIFOLD NEPTUNE II (INSTRUMENTS) ×1 IMPLANT
MAT ABSORB FLUID 56X50 GRAY (MISCELLANEOUS) ×1 IMPLANT
PACK ARTHROSCOPY KNEE (MISCELLANEOUS) ×1 IMPLANT
PAD ABD DERMACEA PRESS 5X9 (GAUZE/BANDAGES/DRESSINGS) ×1 IMPLANT
PAD COLD UNI WRAP-ON (PAD) IMPLANT
SET Y ADAPTER MULIT-BAG IRRIG (MISCELLANEOUS) IMPLANT
SUT ETHILON 3-0 (SUTURE) ×1 IMPLANT
TOWEL OR 17X26 4PK STRL BLUE (TOWEL DISPOSABLE) ×2 IMPLANT
TUBE SET DOUBLEFLO INFLOW (TUBING) ×1 IMPLANT
TUBING OUTFLOW SET DBLFO PUMP (TUBING) ×1 IMPLANT
WAND WEREWOLF FLOW 90D (MISCELLANEOUS) ×1 IMPLANT

## 2024-08-16 NOTE — Anesthesia Procedure Notes (Signed)
 Procedure Name: LMA Insertion Date/Time: 08/16/2024 7:49 AM  Performed by: Myra Lawless, CRNAPre-anesthesia Checklist: Patient identified, Patient being monitored, Timeout performed, Emergency Drugs available and Suction available Patient Re-evaluated:Patient Re-evaluated prior to induction Oxygen Delivery Method: Circle system utilized Preoxygenation: Pre-oxygenation with 100% oxygen Induction Type: IV induction Ventilation: Mask ventilation without difficulty LMA: LMA inserted LMA Size: 5.0 Tube type: Oral Number of attempts: 1 Placement Confirmation: positive ETCO2 and breath sounds checked- equal and bilateral Tube secured with: Tape Dental Injury: Teeth and Oropharynx as per pre-operative assessment  Comments: LMA 5 x1 placement. Atraumatic.

## 2024-08-16 NOTE — Op Note (Addendum)
 Operative Note    SURGERY DATE: 08/16/2024   PRE-OP DIAGNOSIS:  1. Right medial meniscus tear 2. Right lateral meniscus tear 3. Right tricompartmental degenerative changes   POST-OP DIAGNOSIS:  1. Right medial meniscus tear 2. Right incomplete discoid lateral meniscus 3. Right tricompartmental degenerative changes   PROCEDURES:  1. Right knee arthroscopy, partial medial and lateral meniscectomy 2.  Right knee arthroscopic chondroplasty of patellofemoral, medial, and lateral compartments   SURGEON: Earnestine HILARIO Blanch, MD   ASSISTANT: DOROTHA Krystal Doyne, PA   ANESTHESIA: Gen   ESTIMATED BLOOD LOSS: minimal   TOTAL IV FLUIDS: per anesthesia   INDICATION(S):  Amber Ortiz is a 63 y.o. female with signs and symptoms as well as MRI finding of medial meniscus tear and possible lateral meniscus tear.  Symptoms started approximately 10 weeks ago after a fall.  Prior to this, she had no significant pain.  She had failed appropriate nonoperative management.  After discussion of risks, benefits, and alternatives to surgery, the patient elected to proceed.   OPERATIVE FINDINGS:    Examination under anesthesia: A careful examination under anesthesia was performed.  Passive range of motion was: Hyperextension: 1.  Extension: 0.  Flexion: 125.  Lachman: normal. Pivot Shift: normal.  Posterior drawer: normal.  Varus stability in full extension: normal.  Varus stability in 30 degrees of flexion: normal.  Valgus stability in full extension: normal.  Valgus stability in 30 degrees of flexion: normal.   Intra-operative findings: A thorough arthroscopic examination of the knee was performed.  The findings are: 1. Suprapatellar pouch: Normal 2. Undersurface of median ridge: Grade 2-3 degenerative changes 3. Medial patellar facet: Grade 1-2 degenerative changes 4. Lateral patellar facet: Grade 3-4 degenerative changes 5. Trochlea: Grade 3-4 degenerative changes to central trochlea 6. Lateral  gutter/popliteus tendon: Normal 7. Hoffa's fat pad: Inflamed 8. Medial gutter/plica: Normal 9. ACL: Normal 10. PCL: Normal 11. Medial meniscus: Complex tear with radial tear adjacent to the posterior root attachment affecting approximately 80% of the meniscus with.  There is further horizontal tearing extending from the radial tear to the posterior horn/meniscus body region  12. Medial compartment cartilage: Grade 4 degenerative change to the femoral condyle measuring approximately 20 x 8 mm; grade 1-2 degenerative change of the tibial plateau 13. Lateral meniscus: Incomplete discoid meniscus 14. Lateral compartment cartilage: Grade 3 degenerative changes to the central tibial plateau; normal femoral condyle  OPERATIVE REPORT:     I identified Amber Ortiz in the pre-operative holding area. I marked the operative knee with my initials. I reviewed the risks and benefits of the proposed surgical intervention and the patient wished to proceed. The patient was transferred to the operative suite and placed in the supine position with all bony prominences padded.  Anesthesia was administered. Appropriate IV antibiotics were administered prior to incision. The extremity was then prepped and draped in standard fashion. A time out was performed confirming the correct extremity, correct patient, and correct procedure.   Arthroscopy portals were marked. Local anesthetic was injected to the planned portal sites. The anterolateral portal was established with an 11 blade.    The arthroscope was placed in the anterolateral portal and then into the suprapatellar pouch. Next, the medial portal was established under needle localization. A diagnostic knee scope was completed with the above findings.  Using an arthroscopic biter, the contour of the lateral meniscus was debrided to make it a more anatomic shape and to ensure there is no underlying meniscus tear as there  is concern for this on the MRI.  No meniscus  tear was noted after appropriate debridement.    Next, the medial meniscus tear was identified. The MCL was pie-crusted to improve visualization of the posterior horn.  Given the extent of the degenerative changes as well as incomplete nature of the meniscus root tear, decision was made to perform partial meniscectomy.  The meniscal tear was debrided using an arthroscopic biter and an oscillating shaver until the meniscus had stable borders.  This included debriding the upper leaflet of the posterior horn as well as the region around the radial tear.  A chondroplasty was performed of the medial compartment, lateral compartment, and patellofemoral compartment such that there were stable cartilage edges without any loose fragments of cartilage. Arthroscopic fluid was removed from the joint.   The portals were closed with 3-0 Nylon suture. Sterile dressings included Xeroform, 4x4s, Sof-Rol, and Bias wrap. A Polarcare was placed.  The patient was then awakened and taken to the PACU hemodynamically stable without complication.   Of note, assistance from a PA was essential to performing the surgery.  PA was present for the entire surgery.  PA assisted with patient positioning, retraction, instrumentation, and wound closure. The surgery would have been more difficult and had longer operative time without PA assistance.     POSTOPERATIVE PLAN: The patient will be discharged home today once they meet PACU criteria. Aspirin  325 mg daily was prescribed for 2 weeks for DVT prophylaxis.  Physical therapy will start on POD#3-4. Weight-bearing as tolerated. Follow up in 2 weeks per protocol.

## 2024-08-16 NOTE — Anesthesia Postprocedure Evaluation (Signed)
 Anesthesia Post Note  Patient: EMIRA EUBANKS  Procedure(s) Performed: ARTHROSCOPY, KNEE, WITH MEDIAL MENISCECTOMY (Right: Knee)  Patient location during evaluation: PACU Anesthesia Type: General Level of consciousness: awake and alert, oriented and patient cooperative Pain management: pain level controlled Vital Signs Assessment: post-procedure vital signs reviewed and stable Respiratory status: spontaneous breathing, nonlabored ventilation and respiratory function stable Cardiovascular status: blood pressure returned to baseline and stable Postop Assessment: adequate PO intake Anesthetic complications: no   No notable events documented.   Last Vitals:  Vitals:   08/16/24 0930 08/16/24 0945  BP: (!) 144/56 (!) 146/55  Pulse: (!) 51 (!) 52  Resp: 11 12  Temp:  (!) 36.2 C  SpO2: 90% 93%    Last Pain:  Vitals:   08/16/24 0945  PainSc: 0-No pain                 Alfonso Ruths

## 2024-08-16 NOTE — Transfer of Care (Signed)
 Immediate Anesthesia Transfer of Care Note  Patient: [name]  Procedure(s) Performed: ARTHROSCOPY, KNEE, WITH MEDIAL MENISCECTOMY (Right: Knee)  Patient Location: PACU  Anesthesia Type: General  Level of Consciousness: awake, alert  and patient cooperative  Airway and Oxygen Therapy: Patient Spontanous Breathing and Patient connected to supplemental oxygen  Post-op Assessment: Post-op Vital signs reviewed, Patient's Cardiovascular Status Stable, Respiratory Function Stable, Patent Airway and No signs of Nausea or vomiting  Post-op Vital Signs: Reviewed and stable  Complications: No notable events documented.

## 2024-08-16 NOTE — H&P (Signed)
 Paper H&P to be scanned into permanent record. H&P reviewed. No significant changes noted.
